# Patient Record
Sex: Female | Born: 1970 | Race: Black or African American | Hispanic: No | Marital: Single | Smoking: Never smoker
Health system: Southern US, Community
[De-identification: ages and names within clinical notes are randomized; demographics above are authoritative.]

## PROBLEM LIST (undated history)

## (undated) DIAGNOSIS — F32A Depression, unspecified: Secondary | ICD-10-CM

## (undated) DIAGNOSIS — F419 Anxiety disorder, unspecified: Secondary | ICD-10-CM

## (undated) DIAGNOSIS — M25569 Pain in unspecified knee: Secondary | ICD-10-CM

## (undated) DIAGNOSIS — R51 Headache: Secondary | ICD-10-CM

## (undated) DIAGNOSIS — M542 Cervicalgia: Secondary | ICD-10-CM

## (undated) DIAGNOSIS — R519 Headache, unspecified: Secondary | ICD-10-CM

## (undated) DIAGNOSIS — F329 Major depressive disorder, single episode, unspecified: Secondary | ICD-10-CM

## (undated) DIAGNOSIS — M549 Dorsalgia, unspecified: Secondary | ICD-10-CM

## (undated) DIAGNOSIS — M1990 Unspecified osteoarthritis, unspecified site: Secondary | ICD-10-CM

## (undated) HISTORY — DX: Pain in unspecified knee: M25.569

## (undated) HISTORY — DX: Headache: R51

## (undated) HISTORY — DX: Major depressive disorder, single episode, unspecified: F32.9

## (undated) HISTORY — DX: Anxiety disorder, unspecified: F41.9

## (undated) HISTORY — DX: Unspecified osteoarthritis, unspecified site: M19.90

## (undated) HISTORY — DX: Headache, unspecified: R51.9

## (undated) HISTORY — DX: Dorsalgia, unspecified: M54.9

## (undated) HISTORY — DX: Cervicalgia: M54.2

## (undated) HISTORY — DX: Depression, unspecified: F32.A

---

## 2016-03-19 DIAGNOSIS — R51 Headache: Secondary | ICD-10-CM | POA: Diagnosis not present

## 2016-03-24 DIAGNOSIS — M545 Low back pain: Secondary | ICD-10-CM | POA: Diagnosis not present

## 2016-03-31 DIAGNOSIS — M545 Low back pain: Secondary | ICD-10-CM | POA: Diagnosis not present

## 2016-03-31 DIAGNOSIS — M5187 Other intervertebral disc disorders, lumbosacral region: Secondary | ICD-10-CM | POA: Diagnosis not present

## 2016-03-31 DIAGNOSIS — M47816 Spondylosis without myelopathy or radiculopathy, lumbar region: Secondary | ICD-10-CM | POA: Diagnosis not present

## 2016-03-31 DIAGNOSIS — M5136 Other intervertebral disc degeneration, lumbar region: Secondary | ICD-10-CM | POA: Diagnosis not present

## 2016-05-21 DIAGNOSIS — Z01419 Encounter for gynecological examination (general) (routine) without abnormal findings: Secondary | ICD-10-CM | POA: Diagnosis not present

## 2016-05-21 DIAGNOSIS — A609 Anogenital herpesviral infection, unspecified: Secondary | ICD-10-CM | POA: Diagnosis not present

## 2016-05-21 DIAGNOSIS — Z6836 Body mass index (BMI) 36.0-36.9, adult: Secondary | ICD-10-CM | POA: Diagnosis not present

## 2016-05-21 DIAGNOSIS — N760 Acute vaginitis: Secondary | ICD-10-CM | POA: Diagnosis not present

## 2016-05-21 DIAGNOSIS — E559 Vitamin D deficiency, unspecified: Secondary | ICD-10-CM | POA: Diagnosis not present

## 2016-05-21 DIAGNOSIS — Z124 Encounter for screening for malignant neoplasm of cervix: Secondary | ICD-10-CM | POA: Diagnosis not present

## 2016-06-17 DIAGNOSIS — M531 Cervicobrachial syndrome: Secondary | ICD-10-CM | POA: Diagnosis not present

## 2016-06-17 DIAGNOSIS — M5386 Other specified dorsopathies, lumbar region: Secondary | ICD-10-CM | POA: Diagnosis not present

## 2016-06-17 DIAGNOSIS — M5416 Radiculopathy, lumbar region: Secondary | ICD-10-CM | POA: Diagnosis not present

## 2016-06-17 DIAGNOSIS — M9903 Segmental and somatic dysfunction of lumbar region: Secondary | ICD-10-CM | POA: Diagnosis not present

## 2016-06-18 DIAGNOSIS — M5416 Radiculopathy, lumbar region: Secondary | ICD-10-CM | POA: Diagnosis not present

## 2016-06-18 DIAGNOSIS — M9903 Segmental and somatic dysfunction of lumbar region: Secondary | ICD-10-CM | POA: Diagnosis not present

## 2016-06-18 DIAGNOSIS — M5386 Other specified dorsopathies, lumbar region: Secondary | ICD-10-CM | POA: Diagnosis not present

## 2016-06-18 DIAGNOSIS — M531 Cervicobrachial syndrome: Secondary | ICD-10-CM | POA: Diagnosis not present

## 2016-06-22 DIAGNOSIS — M5386 Other specified dorsopathies, lumbar region: Secondary | ICD-10-CM | POA: Diagnosis not present

## 2016-06-22 DIAGNOSIS — M531 Cervicobrachial syndrome: Secondary | ICD-10-CM | POA: Diagnosis not present

## 2016-06-22 DIAGNOSIS — M5416 Radiculopathy, lumbar region: Secondary | ICD-10-CM | POA: Diagnosis not present

## 2016-06-22 DIAGNOSIS — M9903 Segmental and somatic dysfunction of lumbar region: Secondary | ICD-10-CM | POA: Diagnosis not present

## 2016-06-23 DIAGNOSIS — M5416 Radiculopathy, lumbar region: Secondary | ICD-10-CM | POA: Diagnosis not present

## 2016-06-23 DIAGNOSIS — M531 Cervicobrachial syndrome: Secondary | ICD-10-CM | POA: Diagnosis not present

## 2016-06-23 DIAGNOSIS — M9903 Segmental and somatic dysfunction of lumbar region: Secondary | ICD-10-CM | POA: Diagnosis not present

## 2016-06-23 DIAGNOSIS — M5386 Other specified dorsopathies, lumbar region: Secondary | ICD-10-CM | POA: Diagnosis not present

## 2016-06-25 DIAGNOSIS — M5386 Other specified dorsopathies, lumbar region: Secondary | ICD-10-CM | POA: Diagnosis not present

## 2016-06-25 DIAGNOSIS — M5416 Radiculopathy, lumbar region: Secondary | ICD-10-CM | POA: Diagnosis not present

## 2016-06-25 DIAGNOSIS — M9903 Segmental and somatic dysfunction of lumbar region: Secondary | ICD-10-CM | POA: Diagnosis not present

## 2016-06-25 DIAGNOSIS — M531 Cervicobrachial syndrome: Secondary | ICD-10-CM | POA: Diagnosis not present

## 2016-06-29 DIAGNOSIS — M5416 Radiculopathy, lumbar region: Secondary | ICD-10-CM | POA: Diagnosis not present

## 2016-06-29 DIAGNOSIS — M5386 Other specified dorsopathies, lumbar region: Secondary | ICD-10-CM | POA: Diagnosis not present

## 2016-06-29 DIAGNOSIS — M531 Cervicobrachial syndrome: Secondary | ICD-10-CM | POA: Diagnosis not present

## 2016-06-29 DIAGNOSIS — M9903 Segmental and somatic dysfunction of lumbar region: Secondary | ICD-10-CM | POA: Diagnosis not present

## 2016-06-30 DIAGNOSIS — M9903 Segmental and somatic dysfunction of lumbar region: Secondary | ICD-10-CM | POA: Diagnosis not present

## 2016-06-30 DIAGNOSIS — M531 Cervicobrachial syndrome: Secondary | ICD-10-CM | POA: Diagnosis not present

## 2016-06-30 DIAGNOSIS — M797 Fibromyalgia: Secondary | ICD-10-CM | POA: Diagnosis not present

## 2016-06-30 DIAGNOSIS — R102 Pelvic and perineal pain: Secondary | ICD-10-CM | POA: Diagnosis not present

## 2016-06-30 DIAGNOSIS — M5416 Radiculopathy, lumbar region: Secondary | ICD-10-CM | POA: Diagnosis not present

## 2016-06-30 DIAGNOSIS — M5386 Other specified dorsopathies, lumbar region: Secondary | ICD-10-CM | POA: Diagnosis not present

## 2016-07-02 DIAGNOSIS — M531 Cervicobrachial syndrome: Secondary | ICD-10-CM | POA: Diagnosis not present

## 2016-07-02 DIAGNOSIS — M9903 Segmental and somatic dysfunction of lumbar region: Secondary | ICD-10-CM | POA: Diagnosis not present

## 2016-07-02 DIAGNOSIS — M5386 Other specified dorsopathies, lumbar region: Secondary | ICD-10-CM | POA: Diagnosis not present

## 2016-07-02 DIAGNOSIS — M5416 Radiculopathy, lumbar region: Secondary | ICD-10-CM | POA: Diagnosis not present

## 2016-07-06 DIAGNOSIS — M531 Cervicobrachial syndrome: Secondary | ICD-10-CM | POA: Diagnosis not present

## 2016-07-06 DIAGNOSIS — M5416 Radiculopathy, lumbar region: Secondary | ICD-10-CM | POA: Diagnosis not present

## 2016-07-06 DIAGNOSIS — M5386 Other specified dorsopathies, lumbar region: Secondary | ICD-10-CM | POA: Diagnosis not present

## 2016-07-06 DIAGNOSIS — M9903 Segmental and somatic dysfunction of lumbar region: Secondary | ICD-10-CM | POA: Diagnosis not present

## 2016-07-07 DIAGNOSIS — M5386 Other specified dorsopathies, lumbar region: Secondary | ICD-10-CM | POA: Diagnosis not present

## 2016-07-07 DIAGNOSIS — M531 Cervicobrachial syndrome: Secondary | ICD-10-CM | POA: Diagnosis not present

## 2016-07-07 DIAGNOSIS — M5416 Radiculopathy, lumbar region: Secondary | ICD-10-CM | POA: Diagnosis not present

## 2016-07-07 DIAGNOSIS — M9903 Segmental and somatic dysfunction of lumbar region: Secondary | ICD-10-CM | POA: Diagnosis not present

## 2016-07-14 DIAGNOSIS — M531 Cervicobrachial syndrome: Secondary | ICD-10-CM | POA: Diagnosis not present

## 2016-07-14 DIAGNOSIS — M5386 Other specified dorsopathies, lumbar region: Secondary | ICD-10-CM | POA: Diagnosis not present

## 2016-07-14 DIAGNOSIS — M5416 Radiculopathy, lumbar region: Secondary | ICD-10-CM | POA: Diagnosis not present

## 2016-07-14 DIAGNOSIS — M9903 Segmental and somatic dysfunction of lumbar region: Secondary | ICD-10-CM | POA: Diagnosis not present

## 2016-07-15 DIAGNOSIS — M5386 Other specified dorsopathies, lumbar region: Secondary | ICD-10-CM | POA: Diagnosis not present

## 2016-07-15 DIAGNOSIS — M5416 Radiculopathy, lumbar region: Secondary | ICD-10-CM | POA: Diagnosis not present

## 2016-07-15 DIAGNOSIS — M9903 Segmental and somatic dysfunction of lumbar region: Secondary | ICD-10-CM | POA: Diagnosis not present

## 2016-07-15 DIAGNOSIS — M531 Cervicobrachial syndrome: Secondary | ICD-10-CM | POA: Diagnosis not present

## 2016-07-21 DIAGNOSIS — M5386 Other specified dorsopathies, lumbar region: Secondary | ICD-10-CM | POA: Diagnosis not present

## 2016-07-21 DIAGNOSIS — M5416 Radiculopathy, lumbar region: Secondary | ICD-10-CM | POA: Diagnosis not present

## 2016-07-21 DIAGNOSIS — M9903 Segmental and somatic dysfunction of lumbar region: Secondary | ICD-10-CM | POA: Diagnosis not present

## 2016-07-21 DIAGNOSIS — M531 Cervicobrachial syndrome: Secondary | ICD-10-CM | POA: Diagnosis not present

## 2016-07-29 DIAGNOSIS — M5416 Radiculopathy, lumbar region: Secondary | ICD-10-CM | POA: Diagnosis not present

## 2016-07-29 DIAGNOSIS — M9903 Segmental and somatic dysfunction of lumbar region: Secondary | ICD-10-CM | POA: Diagnosis not present

## 2016-07-29 DIAGNOSIS — M531 Cervicobrachial syndrome: Secondary | ICD-10-CM | POA: Diagnosis not present

## 2016-07-29 DIAGNOSIS — M5386 Other specified dorsopathies, lumbar region: Secondary | ICD-10-CM | POA: Diagnosis not present

## 2016-07-30 DIAGNOSIS — M531 Cervicobrachial syndrome: Secondary | ICD-10-CM | POA: Diagnosis not present

## 2016-07-30 DIAGNOSIS — M9903 Segmental and somatic dysfunction of lumbar region: Secondary | ICD-10-CM | POA: Diagnosis not present

## 2016-07-30 DIAGNOSIS — M5386 Other specified dorsopathies, lumbar region: Secondary | ICD-10-CM | POA: Diagnosis not present

## 2016-07-30 DIAGNOSIS — M5416 Radiculopathy, lumbar region: Secondary | ICD-10-CM | POA: Diagnosis not present

## 2016-08-04 DIAGNOSIS — Z712 Person consulting for explanation of examination or test findings: Secondary | ICD-10-CM | POA: Diagnosis not present

## 2016-08-07 DIAGNOSIS — M2550 Pain in unspecified joint: Secondary | ICD-10-CM | POA: Diagnosis not present

## 2016-08-07 DIAGNOSIS — G4733 Obstructive sleep apnea (adult) (pediatric): Secondary | ICD-10-CM | POA: Diagnosis not present

## 2016-08-11 DIAGNOSIS — M5416 Radiculopathy, lumbar region: Secondary | ICD-10-CM | POA: Diagnosis not present

## 2016-08-11 DIAGNOSIS — M9903 Segmental and somatic dysfunction of lumbar region: Secondary | ICD-10-CM | POA: Diagnosis not present

## 2016-08-11 DIAGNOSIS — M531 Cervicobrachial syndrome: Secondary | ICD-10-CM | POA: Diagnosis not present

## 2016-08-11 DIAGNOSIS — M5386 Other specified dorsopathies, lumbar region: Secondary | ICD-10-CM | POA: Diagnosis not present

## 2016-08-25 DIAGNOSIS — M5416 Radiculopathy, lumbar region: Secondary | ICD-10-CM | POA: Diagnosis not present

## 2016-08-25 DIAGNOSIS — M5386 Other specified dorsopathies, lumbar region: Secondary | ICD-10-CM | POA: Diagnosis not present

## 2016-08-25 DIAGNOSIS — M9903 Segmental and somatic dysfunction of lumbar region: Secondary | ICD-10-CM | POA: Diagnosis not present

## 2016-08-25 DIAGNOSIS — M531 Cervicobrachial syndrome: Secondary | ICD-10-CM | POA: Diagnosis not present

## 2016-08-28 DIAGNOSIS — M542 Cervicalgia: Secondary | ICD-10-CM | POA: Diagnosis not present

## 2016-08-28 DIAGNOSIS — Z23 Encounter for immunization: Secondary | ICD-10-CM | POA: Diagnosis not present

## 2016-08-28 DIAGNOSIS — M545 Low back pain: Secondary | ICD-10-CM | POA: Diagnosis not present

## 2016-08-28 DIAGNOSIS — M797 Fibromyalgia: Secondary | ICD-10-CM | POA: Diagnosis not present

## 2016-09-04 DIAGNOSIS — I10 Essential (primary) hypertension: Secondary | ICD-10-CM | POA: Diagnosis not present

## 2016-09-17 DIAGNOSIS — M531 Cervicobrachial syndrome: Secondary | ICD-10-CM | POA: Diagnosis not present

## 2016-09-17 DIAGNOSIS — M5386 Other specified dorsopathies, lumbar region: Secondary | ICD-10-CM | POA: Diagnosis not present

## 2016-09-17 DIAGNOSIS — M9903 Segmental and somatic dysfunction of lumbar region: Secondary | ICD-10-CM | POA: Diagnosis not present

## 2016-09-17 DIAGNOSIS — M5416 Radiculopathy, lumbar region: Secondary | ICD-10-CM | POA: Diagnosis not present

## 2016-11-17 DIAGNOSIS — E669 Obesity, unspecified: Secondary | ICD-10-CM | POA: Diagnosis not present

## 2016-11-20 DIAGNOSIS — M25562 Pain in left knee: Secondary | ICD-10-CM | POA: Diagnosis not present

## 2016-11-20 DIAGNOSIS — M25861 Other specified joint disorders, right knee: Secondary | ICD-10-CM | POA: Diagnosis not present

## 2016-12-09 ENCOUNTER — Ambulatory Visit (INDEPENDENT_AMBULATORY_CARE_PROVIDER_SITE_OTHER): Payer: Federal, State, Local not specified - PPO | Admitting: Neurology

## 2016-12-09 ENCOUNTER — Encounter: Payer: Self-pay | Admitting: Neurology

## 2016-12-09 VITALS — BP 142/97 | HR 101 | Ht 59.0 in | Wt 177.8 lb

## 2016-12-09 DIAGNOSIS — R51 Headache with orthostatic component, not elsewhere classified: Secondary | ICD-10-CM

## 2016-12-09 DIAGNOSIS — G43009 Migraine without aura, not intractable, without status migrainosus: Secondary | ICD-10-CM | POA: Diagnosis not present

## 2016-12-09 DIAGNOSIS — M5412 Radiculopathy, cervical region: Secondary | ICD-10-CM

## 2016-12-09 DIAGNOSIS — F32A Depression, unspecified: Secondary | ICD-10-CM | POA: Insufficient documentation

## 2016-12-09 DIAGNOSIS — F419 Anxiety disorder, unspecified: Secondary | ICD-10-CM | POA: Diagnosis not present

## 2016-12-09 DIAGNOSIS — H539 Unspecified visual disturbance: Secondary | ICD-10-CM

## 2016-12-09 DIAGNOSIS — H9190 Unspecified hearing loss, unspecified ear: Secondary | ICD-10-CM

## 2016-12-09 DIAGNOSIS — M5430 Sciatica, unspecified side: Secondary | ICD-10-CM | POA: Insufficient documentation

## 2016-12-09 DIAGNOSIS — G43709 Chronic migraine without aura, not intractable, without status migrainosus: Secondary | ICD-10-CM

## 2016-12-09 DIAGNOSIS — M5416 Radiculopathy, lumbar region: Secondary | ICD-10-CM

## 2016-12-09 DIAGNOSIS — R519 Headache, unspecified: Secondary | ICD-10-CM

## 2016-12-09 DIAGNOSIS — G8929 Other chronic pain: Secondary | ICD-10-CM

## 2016-12-09 DIAGNOSIS — M797 Fibromyalgia: Secondary | ICD-10-CM

## 2016-12-09 DIAGNOSIS — M5431 Sciatica, right side: Secondary | ICD-10-CM

## 2016-12-09 DIAGNOSIS — M542 Cervicalgia: Secondary | ICD-10-CM | POA: Diagnosis not present

## 2016-12-09 DIAGNOSIS — F329 Major depressive disorder, single episode, unspecified: Secondary | ICD-10-CM | POA: Insufficient documentation

## 2016-12-09 MED ORDER — AMITRIPTYLINE HCL 10 MG PO TABS: 20.0000 mg | ORAL_TABLET | Freq: Every day | ORAL | 6 refills | Status: AC

## 2016-12-09 NOTE — Patient Instructions (Addendum)
Remember to drink plenty of fluid, eat healthy meals and do not skip any meals. Try to eat protein with a every meal and eat a healthy snack such as fruit or nuts in between meals. Try to keep a regular sleep-wake schedule and try to exercise daily, particularly in the form of walking, 20-30 minutes a day, if you can.   As far as your medications are concerned, I would like to suggest: Amitriptyline start with 10mg  at night and then can increase to 20mg  at night in one week  As far as diagnostic testing: MRI brain, [facility], emg/ncs  I would like to see you back for emg/ncs, sooner if we need to. Please call us with any interim questions, concerns, problems, updates or refill requests.   Our phone number is [phone]. We also have an after hours call service for urgent matters and there is a physician on-call for urgent questions. For any emergencies you know to call 911 or go to the nearest emergency room  Amitriptyline tablets  What should I tell my health care provider before I take this medicine? They need to know if you have any of these conditions: -an alcohol problem -asthma, difficulty breathing -bipolar disorder or schizophrenia -difficulty passing urine, prostate trouble -glaucoma -heart disease or previous heart attack -liver disease -over active thyroid -seizures -thoughts or plans of suicide, a previous suicide attempt, or family history of suicide attempt -an unusual or allergic reaction to amitriptyline, other medicines, foods, dyes, or preservatives -pregnant or trying to get pregnant -breast-feeding How should I use this medicine? Take this medicine by mouth with a drink of water. Follow the directions on the prescription label. You can take the tablets with or without food. Take your medicine at regular intervals. Do not take it more often than directed. Do not stop taking this medicine suddenly except upon the advice of your doctor. Stopping this  medicine too quickly may cause serious side effects or your condition may worsen. A special MedGuide will be given to you by the pharmacist with each prescription and refill. Be sure to read this information carefully each time. Talk to your pediatrician regarding the use of this medicine in children. Special care may be needed. Overdosage: If you think you have taken too much of this medicine contact a poison control center or emergency room at once. NOTE: This medicine is only for you. Do not share this medicine with others. What if I miss a dose? If you miss a dose, take it as soon as you can. If it is almost time for your next dose, take only that dose. Do not take double or extra doses. What may interact with this medicine? Do not take this medicine with any of the following medications: -arsenic trioxide -certain medicines used to regulate abnormal heartbeat or to treat other heart conditions -cisapride -droperidol -halofantrine -linezolid -MAOIs like Carbex, Eldepryl, Marplan, Nardil, and Parnate -methylene blue -other medicines for mental depression -phenothiazines like perphenazine, thioridazine and chlorpromazine -pimozide -probucol -procarbazine -sparfloxacin -St. John's Wort -ziprasidone This medicine may also interact with the following medications: -atropine and related drugs like hyoscyamine, scopolamine, tolterodine and others -barbiturate medicines for inducing sleep or treating seizures, like phenobarbital -cimetidine -disulfiram -ethchlorvynol -thyroid hormones such as levothyroxine This list may not describe all possible interactions. Give your health care provider a list of all the medicines, herbs, non-prescription drugs, or dietary supplements you use. Also tell them if you smoke, drink alcohol, or use illegal drugs. Some items may  interact with your medicine. What should I watch for while using this medicine? Tell your doctor if your symptoms do not get  better or if they get worse. Visit your doctor or health care professional for regular checks on your progress. Because it may take several weeks to see the full effects of this medicine, it is important to continue your treatment as prescribed by your doctor. Patients and their families should watch out for new or worsening thoughts of suicide or depression. Also watch out for sudden changes in feelings such as feeling anxious, agitated, panicky, irritable, hostile, aggressive, impulsive, severely restless, overly excited and hyperactive, or not being able to sleep. If this happens, especially at the beginning of treatment or after a change in dose, call your health care professional. Dennis Bast may get drowsy or dizzy. Do not drive, use machinery, or do anything that needs mental alertness until you know how this medicine affects you. Do not stand or sit up quickly, especially if you are an older patient. This reduces the risk of dizzy or fainting spells. Alcohol may interfere with the effect of this medicine. Avoid alcoholic drinks. Do not treat yourself for coughs, colds, or allergies without asking your doctor or health care professional for advice. Some ingredients can increase possible side effects. Your mouth may get dry. Chewing sugarless gum or sucking hard candy, and drinking plenty of water will help. Contact your doctor if the problem does not go away or is severe. This medicine may cause dry eyes and blurred vision. If you wear contact lenses you may feel some discomfort. Lubricating drops may help. See your eye doctor if the problem does not go away or is severe. This medicine can cause constipation. Try to have a bowel movement at least every 2 to 3 days. If you do not have a bowel movement for 3 days, call your doctor or health care professional. This medicine can make you more sensitive to the sun. Keep out of the sun. If you cannot avoid being in the sun, wear protective clothing and use  sunscreen. Do not use sun lamps or tanning beds/booths. What side effects may I notice from receiving this medicine? Side effects that you should report to your doctor or health care professional as soon as possible: -allergic reactions like skin rash, itching or hives, swelling of the face, lips, or tongue -anxious -breathing problems -changes in vision -confusion -elevated mood, decreased need for sleep, racing thoughts, impulsive behavior -eye pain -fast, irregular heartbeat -feeling faint or lightheaded, falls -feeling agitated, angry, or irritable -fever with increased sweating -hallucination, loss of contact with reality -seizures -stiff muscles -suicidal thoughts or other mood changes -tingling, pain, or numbness in the feet or hands -trouble passing urine or change in the amount of urine -trouble sleeping -unusually weak or tired -vomiting -yellowing of the eyes or skin Side effects that usually do not require medical attention (report to your doctor or health care professional if they continue or are bothersome): -change in sex drive or performance -change in appetite or weight -constipation -dizziness -dry mouth -nausea -tired -tremors -upset stomach This list may not describe all possible side effects. Call your doctor for medical advice about side effects. You may report side effects to FDA at 1-800-FDA-1088. Where should I keep my medicine? Keep out of the reach of children. Store at room temperature between 20 and 25 degrees C (68 and 77 degrees F). Throw away any unused medicine after the expiration date. NOTE: This sheet is  a summary. It may not cover all possible information. If you have questions about this medicine, talk to your doctor, pharmacist, or health care provider.  2017 Elsevier/Gold Standard (2016-03-27 12:14:15)

## 2016-12-09 NOTE — Progress Notes (Addendum)
[facility]    Provider:  Dr Jaynee Eagles Referring Provider: Maurice Small, MD Primary Care Physician:  Jonathon Bellows, MD  CC:  "Migraines, sciatic nerve, cervical neuralgia, right and left arm radiculopathy)  HPI:  Veronica Owen is a 46 y.o. female here as a referral from Dr. Justin Mend for worsening radiculopathy. Past medical history  migraine, depression, anxiety, fibromyalgia, obesity, fibromyalgia brain fog, PTSD. She says today her headaches are the main concern. She has had headaches since the 90s in the [employer]. Initially would "come and go" and would produce anxiety and depression. Stress is now a trigger as is her fibromyalgia with whole body pain and then migraines and headaches. Her head feels heavy and "stuffed", a lot of pressure. She has a lot of cervical muscle pain, starts more on hte right and right side of the head, a lot of pressure on the top and sides and frontal area as well as on the back of the head. She has the headaches 2-3x a week and they can last several days . Started worsening the beginning of 2016. She has headaches 8-15 migrainous headache days a month. Taking a break or coffee helps. She has light and sound sensitivity and smell triggers, dark rooms help. She has nausea but no vomiting. Mother had migraines. She has vision changes, her vision is worsening especially with a headache she has acute vision worsening. She has numbness and tingling in the arms since 2012. Worsening in frequency and severity. She has been diagnosed with cervical neuralgia. She has weakness sin the arms, she can;t open a water bottle to grip it. Even driving sometimes to hold her arms up can be difficult. She has a lot of pain when she sleeps with insomnia. She has had an emg/ncs which was negative in 2015 as well as an MRI of the cervical spine. She has sciatica as well.   Meds tried: Topamax, imitrex (which made her feel bad like she was wound up all the time, increased anxiety),  cymbalta (just started 60 mg), gabapentin 200mg  (can't tolerate during the day), flexeril.  Reviewed notes, labs and imaging from outside physicians, which showed:  Reviewed 34 pages from the [place]:   Labs collected 03/31/2016 include normal CMP with creatinine 0.937, BUN 9, vitamin D low 15.85, TSH 1.56, cholesterol 190, triglycerides 158, LDL 91, normal LFTs, HIV negative hep C negative, EKG with tachycardia sinus otherwise normal, CBC 12/19/2014 was unremarkable. Negative urine drug screen 11/28/2014. Sedimentation rate 10/17/2014 was 30, CCP IgG antibodies less than 16, ANA negative, anti-chromatin antibody negative, anti-scleroderma 70 antibody negative anticentromere B antibodies negative, Smith RNP antibody negative, anti-ribosomal P protein negative, anti-RNP negative, specifically antibody negative, Sjogren's antibody negative SSB and SSA, anti-Jo 1 negative anti-double-stranded DNA negative, rheumatoid factor less than 10 which is normal. Vitamin B12 on November 2018 was normal for 63.  MRI of the lumbar spine: 04/23/2016 showed mild lower lumbar discogenic degenerative disease specifically a broad small central disc protrusion at L4-L5 with mild bilateral facet arthropathy and mild bilateral foraminal narrowing. At L5-S1 there is a broad moderate central disc protrusion with associated annular fissure. There is mild bilateral facet arthropathy. There is mild bilateral foraminal narrowing. There is no canal stenosis. The disc protrusion appears slightly increased in size compared with 2015. Otherwise no interval change. No paraspinal abnormalities seen. There is no canal stenosis. No nerve root pinching.  MRI of the cervical spine 01/31/2015 impression cervical spondylosis with multilevel neural foraminal narrowing specifically C2-C3 subtle  and uncovertebral hypertrophy, C3-C4 mild bilateral uncovertebral hypertrophy and neural foraminal encroachment, C4-C5 mild disc height loss, moderate broad  disc bulge with moderate to severe uncovertebral hypertrophy, zagopophyseal facets normal, narrowing of the ventral thecal sac, moderate left and near severe right neural foraminal narrowing. C5-C6 mild disc height loss and broad disc bulge, moderate uncovertebral hypertrophy, mild impression on the ventral cord surface, mild zagopophyseal facet arthrosis, moderate bilateral neural foraminal narrowing. C6-C7 mild disc height loss and broad disc bulge, moderate uncovertebral hypertrophy, mild bilateral neural foraminal narrowing, C7-T1 central disc bulge facets normal.   Review of Systems: Patient complains of symptoms per HPI as well as the following symptoms: Fatigue, blurred vision, joint pain, aching muscles, headache, weakness, sleepiness, depression, anxiety, decreased energy, disinterest in activities. Pertinent negatives per HPI. All others negative.   Social History   Social History  . Marital status: Single    Spouse name: N/A  . Number of children: 1  . Years of education: Doctoral   Occupational History  . [employer]    Social History Main Topics  . Smoking status: Never Smoker  . Smokeless tobacco: Never Used  . Alcohol use Yes     Comment: 5-8 oz  . Drug use: Yes    Types: Marijuana     Comment: Very rare for pain  . Sexual activity: Not on file   Other Topics Concern  . Not on file   Social History Narrative   Lives with daughter   Caffeine use: 10-20oz (coffee daily)   Tea/soda- ocass    Family History  Problem Relation Age of Onset  . High Cholesterol Mother   . Diabetes Mother   . Hypertension Father   . Anxiety disorder Father   . Depression Father   . Breast cancer Sister     Past Medical History:  Diagnosis Date  . Arthritis   . Back pain   . Headache   . Knee pain bilat  . Neck pain     Past Surgical History:  Procedure Laterality Date  . CESAREAN SECTION  1994    Current Outpatient Prescriptions  Medication Sig Dispense  Refill  . amLODipine (NORVASC) 5 MG tablet Take 5 mg by mouth daily.    . cyclobenzaprine (FLEXERIL) 10 MG tablet Take 10 mg by mouth as needed for muscle spasms.    . DULoxetine (CYMBALTA) 60 MG capsule Take 60 mg by mouth daily.    Marland Kitchen gabapentin (NEURONTIN) 100 MG capsule Take 200 mg by mouth at bedtime.    . naproxen (NAPROSYN) 500 MG tablet Take 500 mg by mouth as needed.    . pantoprazole (PROTONIX) 40 MG tablet Take 40 mg by mouth daily.    . phentermine 37.5 MG capsule Take 1 capsule by mouth daily.    . traZODone (DESYREL) 50 MG tablet Take 50 mg by mouth at bedtime. Takes 0.5-1 tablet as needed    . amitriptyline (ELAVIL) 10 MG tablet Take 2 tablets (20 mg total) by mouth at bedtime. 60 tablet 6   No current facility-administered medications for this visit.     Allergies as of 12/09/2016  . (No Known Allergies)    Vitals: BP (!) 142/97 (BP Location: Right Arm, Patient Position: Sitting, Cuff Size: Large)   Pulse (!) 101   Ht 4\' 11"  (1.499 m)   Wt 177 lb 12.8 oz (80.6 kg)   BMI 35.91 kg/m  Last Weight:  Wt Readings from Last 1 Encounters:  12/09/16 177  lb 12.8 oz (80.6 kg)   Last Height:   Ht Readings from Last 1 Encounters:  12/09/16 4\' 11"  (1.499 m)   Physical exam: Exam: Gen: NAD, conversant, well nourised, obese, well groomed                     CV: RRR, no MRG. No Carotid Bruits. No peripheral edema, warm, nontender Eyes: Conjunctivae clear without exudates or hemorrhage MSK: hypertrophied right trapezius and paraspinal muscles, tender to palpation  Neuro: Detailed Neurologic Exam  Speech:    Speech is normal; fluent and spontaneous with normal comprehension.  Cognition:    The patient is oriented to person, place, and time;     recent and remote memory intact;     language fluent;     normal attention, concentration,     fund of knowledge Cranial Nerves:    The pupils are equal, round, and reactive to light. The fundi are normal and spontaneous venous  pulsations are present. Visual fields are full to finger confrontation. Extraocular movements are intact. Trigeminal sensation is intact and the muscles of mastication are normal. The face is symmetric. The palate elevates in the midline. Hearing intact. Voice is normal. Shoulder shrug is normal. The tongue has normal motion without fasciculations.   Coordination:    Normal finger to nose and heel to shin. Normal rapid alternating movements.   Gait:    Heel-toe and tandem gait are normal.   Motor Observation:    No asymmetry, no atrophy, and no involuntary movements noted. Tone:    Normal muscle tone.    Posture:    Posture is normal. normal erect    Strength:    Strength is V/V in the upper and lower limbs.      Sensation: intact to LT     Reflex Exam:  DTR's:    Deep tendon reflexes in the upper and lower extremities are normal bilaterally.   Toes:    The toes are downgoing bilaterally.   Clonus:    Clonus is absent.   Assessment/Plan:  46 y.o. female here as a referral from Dr. Justin Mend for worsening radiculopathy. Past medical history hypertension, migraine, depression, anxiety, fibromyalgia, obesity, fibromyalgia brain fog, PTSD. She has chronic migraines, cervicalgia, musculoskeletal neck pain, cervical radiculopathy and sciatica  - Emg/ncs of bilateral upper extremities and right leg to eval for cervical radiculopathy and sciatic neuropathy/lumbar radiculopathy - Patient has hypertrophied right trapezius and other cervical muscles that can be contributing to her migraine and cervicalgia. She does have a lot of musculoskeletal neck pain, chronic pain and fibromyalgia as well as sciatica. Refer her to [facility] for physical therapy, deep tissue massage, acupuncture, stress and pain management, biofeedback. - MRI of the brain given her chronic headaches worse supine/positionally, vision/hearing changes to evaluate for other intracranial abnormalities that can cause  headaches such as lesions or intracranial hypertension. - As far as your medications are concerned, I would like to suggest: Amitriptyline start with 10mg  at night and then can increase to 20mg  at night in one week   Discussed the following: To prevent or relieve headaches, try the following: Cool Compress. Lie down and place a cool compress on your head.  Avoid headache triggers. If certain foods or odors seem to have triggered your migraines in the past, avoid them. A headache diary might help you identify triggers.  Include physical activity in your daily routine. Try a daily walk or other moderate aerobic exercise.  Manage stress.  Find healthy ways to cope with the stressors, such as delegating tasks on your to-do list.  Practice relaxation techniques. Try deep breathing, yoga, massage and visualization.  Eat regularly. Eating regularly scheduled meals and maintaining a healthy diet might help prevent headaches. Also, drink plenty of fluids.  Follow a regular sleep schedule. Sleep deprivation might contribute to headaches Consider biofeedback. With this mind-body technique, you learn to control certain bodily functions - such as muscle tension, heart rate and blood pressure - to prevent headaches or reduce headache pain.    Proceed to emergency room if you experience new or worsening symptoms or symptoms do not resolve, if you have new neurologic symptoms or if headache is severe, or for any concerning symptom.   CC: Dr. Fabio Asa, MD  [facility] [address] [place],  [address]  Phone [phone] Fax [phone]

## 2016-12-15 ENCOUNTER — Telehealth: Payer: Self-pay | Admitting: Neurology

## 2016-12-15 NOTE — Telephone Encounter (Signed)
Sent via fax to [facility]  [phone]. Thanks Hinton Dyer

## 2016-12-22 DIAGNOSIS — M545 Low back pain: Secondary | ICD-10-CM | POA: Diagnosis not present

## 2016-12-22 DIAGNOSIS — M542 Cervicalgia: Secondary | ICD-10-CM | POA: Diagnosis not present

## 2016-12-22 DIAGNOSIS — G43909 Migraine, unspecified, not intractable, without status migrainosus: Secondary | ICD-10-CM | POA: Diagnosis not present

## 2016-12-22 DIAGNOSIS — M797 Fibromyalgia: Secondary | ICD-10-CM | POA: Diagnosis not present

## 2016-12-22 DIAGNOSIS — M25562 Pain in left knee: Secondary | ICD-10-CM | POA: Diagnosis not present

## 2016-12-24 DIAGNOSIS — G4733 Obstructive sleep apnea (adult) (pediatric): Secondary | ICD-10-CM | POA: Diagnosis not present

## 2016-12-29 DIAGNOSIS — M797 Fibromyalgia: Secondary | ICD-10-CM | POA: Diagnosis not present

## 2016-12-29 DIAGNOSIS — G43909 Migraine, unspecified, not intractable, without status migrainosus: Secondary | ICD-10-CM | POA: Diagnosis not present

## 2016-12-29 DIAGNOSIS — M545 Low back pain: Secondary | ICD-10-CM | POA: Diagnosis not present

## 2016-12-29 DIAGNOSIS — M542 Cervicalgia: Secondary | ICD-10-CM | POA: Diagnosis not present

## 2017-01-05 DIAGNOSIS — M542 Cervicalgia: Secondary | ICD-10-CM | POA: Diagnosis not present

## 2017-01-05 DIAGNOSIS — M545 Low back pain: Secondary | ICD-10-CM | POA: Diagnosis not present

## 2017-01-05 DIAGNOSIS — M797 Fibromyalgia: Secondary | ICD-10-CM | POA: Diagnosis not present

## 2017-01-05 DIAGNOSIS — G43909 Migraine, unspecified, not intractable, without status migrainosus: Secondary | ICD-10-CM | POA: Diagnosis not present

## 2017-01-08 DIAGNOSIS — G4733 Obstructive sleep apnea (adult) (pediatric): Secondary | ICD-10-CM | POA: Diagnosis not present

## 2017-01-19 DIAGNOSIS — G43909 Migraine, unspecified, not intractable, without status migrainosus: Secondary | ICD-10-CM | POA: Diagnosis not present

## 2017-01-19 DIAGNOSIS — M797 Fibromyalgia: Secondary | ICD-10-CM | POA: Diagnosis not present

## 2017-01-19 DIAGNOSIS — M542 Cervicalgia: Secondary | ICD-10-CM | POA: Diagnosis not present

## 2017-01-19 DIAGNOSIS — M545 Low back pain: Secondary | ICD-10-CM | POA: Diagnosis not present

## 2017-01-21 ENCOUNTER — Encounter: Payer: Federal, State, Local not specified - PPO | Admitting: Neurology

## 2017-01-25 DIAGNOSIS — H524 Presbyopia: Secondary | ICD-10-CM | POA: Diagnosis not present

## 2017-01-25 DIAGNOSIS — H35033 Hypertensive retinopathy, bilateral: Secondary | ICD-10-CM | POA: Diagnosis not present

## 2017-01-25 DIAGNOSIS — H04123 Dry eye syndrome of bilateral lacrimal glands: Secondary | ICD-10-CM | POA: Diagnosis not present

## 2017-01-25 DIAGNOSIS — H33321 Round hole, right eye: Secondary | ICD-10-CM | POA: Diagnosis not present

## 2017-02-12 DIAGNOSIS — Z113 Encounter for screening for infections with a predominantly sexual mode of transmission: Secondary | ICD-10-CM | POA: Diagnosis not present

## 2017-02-12 DIAGNOSIS — N760 Acute vaginitis: Secondary | ICD-10-CM | POA: Diagnosis not present

## 2017-02-16 DIAGNOSIS — B373 Candidiasis of vulva and vagina: Secondary | ICD-10-CM | POA: Diagnosis not present

## 2017-02-16 DIAGNOSIS — N760 Acute vaginitis: Secondary | ICD-10-CM | POA: Diagnosis not present

## 2017-03-04 ENCOUNTER — Ambulatory Visit (INDEPENDENT_AMBULATORY_CARE_PROVIDER_SITE_OTHER): Payer: Self-pay | Admitting: Neurology

## 2017-03-04 ENCOUNTER — Ambulatory Visit (INDEPENDENT_AMBULATORY_CARE_PROVIDER_SITE_OTHER): Payer: Federal, State, Local not specified - PPO | Admitting: Neurology

## 2017-03-04 DIAGNOSIS — M5410 Radiculopathy, site unspecified: Secondary | ICD-10-CM

## 2017-03-04 DIAGNOSIS — M5416 Radiculopathy, lumbar region: Secondary | ICD-10-CM

## 2017-03-04 DIAGNOSIS — R52 Pain, unspecified: Secondary | ICD-10-CM | POA: Insufficient documentation

## 2017-03-04 DIAGNOSIS — R519 Headache, unspecified: Secondary | ICD-10-CM

## 2017-03-04 DIAGNOSIS — M5412 Radiculopathy, cervical region: Secondary | ICD-10-CM | POA: Diagnosis not present

## 2017-03-04 DIAGNOSIS — G8929 Other chronic pain: Secondary | ICD-10-CM

## 2017-03-04 DIAGNOSIS — R51 Headache: Principal | ICD-10-CM

## 2017-03-04 DIAGNOSIS — Z0289 Encounter for other administrative examinations: Secondary | ICD-10-CM

## 2017-03-04 DIAGNOSIS — G43009 Migraine without aura, not intractable, without status migrainosus: Secondary | ICD-10-CM

## 2017-03-04 DIAGNOSIS — M542 Cervicalgia: Secondary | ICD-10-CM

## 2017-03-04 NOTE — Progress Notes (Signed)
Full Name: Veronica Owen Gender: Female MRN #: [id number] Date of Birth: 02/05/1971    Visit Date: 03/04/2017 09:58 Age: 46 Years 0 Months Old Examining Physician: Sarina Ill, MD  Referring Physician: Maurice Small, MD    History: This is a 46 year old patient here as a referral for worsening radiculopathy.  Summary: Nerve conduction and EMG studies were performed on the right upper and right lower extremities. All muscles and nerves (as detailed in the following tables) were normal  Conclusion: This is a normal study.  Cc: Dr. Fabio Asa, M.D.  [facility] [place], [place] [address] Tel: [phone] Fax: [phone]        [facility]    Nerve / Sites Rec. Site Latency Ref. Amplitude Ref. Rel Amp Segments Distance Velocity Ref. Area    ms ms mV mV %  cm m/s m/s mVms  R Median - APB     Wrist APB 2.9 ?4.4 6.6 ?4.0 100 Wrist - APB 7   27.2     Upper arm APB 6.4  6.4  96.5 Upper arm - Wrist 18 52 ?49 26.1  R Ulnar - ADM     Wrist ADM 2.0 ?3.3 9.4 ?6.0 100 Wrist - ADM 7   26.4     B.Elbow ADM 4.5  9.2  98.4 B.Elbow - Wrist 16 65 ?49 25.3     A.Elbow ADM 6.5  9.1  98.5 A.Elbow - B.Elbow 12 59 ?49 25.5         A.Elbow - Wrist      R Peroneal - EDB     Ankle EDB 4.0 ?6.5 7.1 ?2.0 100 Ankle - EDB 9   20.8     Fib head EDB 9.4  6.9  96.6 Fib head - Ankle 27 50 ?44 19.1     Pop fossa EDB 11.1  6.7  97.3 Pop fossa - Fib head 10 56 ?44 18.9         Pop fossa - Ankle      R Tibial - AH     Ankle AH 3.6 ?5.8 6.1 ?4.0 100 Ankle - AH 9   12.5     Pop fossa AH 11.2  3.4  54.9 Pop fossa - Ankle 33 44 ?41 10.6             [facility]    Nerve / Sites Rec. Site Peak Lat Ref.  Amp Ref. Segments Distance Peak Diff Ref.    ms ms V V  cm ms ms  R Sural - Ankle (Calf)     Calf Ankle 2.45 ?4.40 35 ?6 Calf - Ankle 14    R Superficial peroneal - Ankle     Lat leg Ankle 3.18 ?4.40 19 ?6 Lat leg - Ankle 14    R Median, Ulnar - Transcarpal comparison   Median Palm Wrist 1.98 ?2.20 63 ?35 Median Palm - Wrist 8       Ulnar Palm Wrist 1.88 ?2.20 10 ?12 Ulnar Palm - Wrist 8          Median Palm - Ulnar Palm  0.1 ?0.4  R Median - Orthodromic (Dig II, Mid palm)     Dig II Wrist 2.86 ?3.40 19 ?10 Dig II - Wrist 13    R Ulnar - Orthodromic, (Dig V, Mid palm)     Dig V Wrist 2.24 ?3.10 13 ?5 Dig V - Wrist 11  F  Wave    Nerve F Lat Ref.   ms ms  R Median - APB 22.8 ?31.0  R Tibial - AH 45.5 ?56.0         EMG full       EMG Summary Table    Spontaneous MUAP Recruitment  Muscle IA Fib PSW Fasc Other Amp Dur. Poly Pattern  R. Deltoid Normal None None None _______ Normal Normal Normal Normal  R. Trapezius Normal None None None _______ Normal Normal Normal Normal  R. Pronator teres Normal None None None _______ Normal Normal Normal Normal  R. First dorsal interosseous Normal None None None _______ Normal Normal Normal Normal  R. Opponens pollicis Normal None None None _______ Normal Normal Normal Normal  R. Cervical paraspinals (low) Normal None None None _______ Normal Normal Normal Normal  R. Lumbar paraspinals (low) Normal None None None _______ Normal Normal Normal Normal  R. Vastus medialis Normal None None None _______ Normal Normal Normal Normal  R. Tibialis anterior Normal None None None _______ Normal Normal Normal Normal  R. Gastrocnemius (Medial head) Normal None None None _______ Normal Normal Normal Normal  R. Gluteus maximus Normal None None None _______ Normal Normal Normal Normal  R. Biceps femoris (long head) Normal None None None _______ Normal Normal Normal Normal  R. Biceps femoris (short head) Normal None None None _______ Normal Normal Normal Normal

## 2017-03-04 NOTE — Addendum Note (Signed)
Addended by: Sarina Ill B on: 03/04/2017 10:53 AM   Modules accepted: Orders

## 2017-03-05 LAB — BASIC METABOLIC PANEL
BUN/Creatinine Ratio: 10 (ref 9–23)
BUN: 8 mg/dL (ref 6–24)
CO2: 26 mmol/L (ref 18–29)
CREATININE: 0.79 mg/dL (ref 0.57–1.00)
Calcium: 8.9 mg/dL (ref 8.7–10.2)
Chloride: 103 mmol/L (ref 96–106)
GFR, EST AFRICAN AMERICAN: 104 mL/min/{1.73_m2} (ref >59)
GFR, EST NON AFRICAN AMERICAN: 90 mL/min/{1.73_m2} (ref >59)
Glucose: 77 mg/dL (ref 65–99)
Potassium: 4 mmol/L (ref 3.5–5.2)
SODIUM: 143 mmol/L (ref 134–144)

## 2017-03-06 NOTE — Progress Notes (Signed)
See procedure note.

## 2017-03-06 NOTE — Procedures (Signed)
Full Name: Veronica Owen Gender: Female MRN #: [id number] Date of Birth: [birth year]    Visit Date: 03/04/2017 09:58 Age: 46 Years 0 Months Old Examining Physician: Sarina Ill, MD  Referring Physician: Maurice Small, MD    History: This is a 46 year old patient here as a referral for worsening radiculopathy.  Summary: Nerve conduction and EMG studies were performed on the right upper and right lower extremities. All muscles and nerves (as detailed in the following tables) were normal  Conclusion: This is a normal study.  Cc: Dr. Fabio Asa, M.D.  [facility] [place], [place] [address] Tel: [phone] Fax: [phone]        [facility]    Nerve / Sites Rec. Site Latency Ref. Amplitude Ref. Rel Amp Segments Distance Velocity Ref. Area    ms ms mV mV %  cm m/s m/s mVms  R Median - APB     Wrist APB 2.9 ?4.4 6.6 ?4.0 100 Wrist - APB 7   27.2     Upper arm APB 6.4  6.4  96.5 Upper arm - Wrist 18 52 ?49 26.1  R Ulnar - ADM     Wrist ADM 2.0 ?3.3 9.4 ?6.0 100 Wrist - ADM 7   26.4     B.Elbow ADM 4.5  9.2  98.4 B.Elbow - Wrist 16 65 ?49 25.3     A.Elbow ADM 6.5  9.1  98.5 A.Elbow - B.Elbow 12 59 ?49 25.5         A.Elbow - Wrist      R Peroneal - EDB     Ankle EDB 4.0 ?6.5 7.1 ?2.0 100 Ankle - EDB 9   20.8     Fib head EDB 9.4  6.9  96.6 Fib head - Ankle 27 50 ?44 19.1     Pop fossa EDB 11.1  6.7  97.3 Pop fossa - Fib head 10 56 ?44 18.9         Pop fossa - Ankle      R Tibial - AH     Ankle AH 3.6 ?5.8 6.1 ?4.0 100 Ankle - AH 9   12.5     Pop fossa AH 11.2  3.4  54.9 Pop fossa - Ankle 33 44 ?41 10.6             SNC    Nerve / Sites Rec. Site Peak Lat Ref.  Amp Ref. Segments Distance Peak Diff Ref.    ms ms V V  cm ms ms  R Sural - Ankle (Calf)     Calf Ankle 2.45 ?4.40 35 ?6 Calf - Ankle 14    R Superficial peroneal - Ankle     Lat leg Ankle 3.18 ?4.40 19 ?6 Lat leg - Ankle 14    R Median, Ulnar - Transcarpal comparison   Median Palm Wrist 1.98 ?2.20 63 ?35 Median Palm - Wrist 8       Ulnar Palm Wrist 1.88 ?2.20 10 ?12 Ulnar Palm - Wrist 8          Median Palm - Ulnar Palm  0.1 ?0.4  R Median - Orthodromic (Dig II, Mid palm)     Dig II Wrist 2.86 ?3.40 19 ?10 Dig II - Wrist 13    R Ulnar - Orthodromic, (Dig V, Mid palm)     Dig V Wrist 2.24 ?3.10 13 ?5 Dig V - Wrist 11  F  Wave    Nerve F Lat Ref.   ms ms  R Median - APB 22.8 ?31.0  R Tibial - AH 45.5 ?56.0         EMG full       EMG Summary Table    Spontaneous MUAP Recruitment  Muscle IA Fib PSW Fasc Other Amp Dur. Poly Pattern  R. Deltoid Normal None None None _______ Normal Normal Normal Normal  R. Trapezius Normal None None None _______ Normal Normal Normal Normal  R. Pronator teres Normal None None None _______ Normal Normal Normal Normal  R. First dorsal interosseous Normal None None None _______ Normal Normal Normal Normal  R. Opponens pollicis Normal None None None _______ Normal Normal Normal Normal  R. Cervical paraspinals (low) Normal None None None _______ Normal Normal Normal Normal  R. Lumbar paraspinals (low) Normal None None None _______ Normal Normal Normal Normal  R. Vastus medialis Normal None None None _______ Normal Normal Normal Normal  R. Tibialis anterior Normal None None None _______ Normal Normal Normal Normal  R. Gastrocnemius (Medial head) Normal None None None _______ Normal Normal Normal Normal  R. Gluteus maximus Normal None None None _______ Normal Normal Normal Normal  R. Biceps femoris (long head) Normal None None None _______ Normal Normal Normal Normal  R. Biceps femoris (short head) Normal None None None _______ Normal Normal Normal Normal

## 2017-03-10 ENCOUNTER — Ambulatory Visit (INDEPENDENT_AMBULATORY_CARE_PROVIDER_SITE_OTHER): Payer: Federal, State, Local not specified - PPO

## 2017-03-10 DIAGNOSIS — G8929 Other chronic pain: Secondary | ICD-10-CM

## 2017-03-10 DIAGNOSIS — H539 Unspecified visual disturbance: Secondary | ICD-10-CM

## 2017-03-10 DIAGNOSIS — R51 Headache with orthostatic component, not elsewhere classified: Secondary | ICD-10-CM

## 2017-03-10 DIAGNOSIS — H905 Unspecified sensorineural hearing loss: Secondary | ICD-10-CM | POA: Diagnosis not present

## 2017-03-10 DIAGNOSIS — H9190 Unspecified hearing loss, unspecified ear: Secondary | ICD-10-CM

## 2017-03-10 DIAGNOSIS — R519 Headache, unspecified: Secondary | ICD-10-CM

## 2017-03-10 MED ORDER — GADOPENTETATE DIMEGLUMINE 469.01 MG/ML IV SOLN: 17.0000 mL | Freq: Once | INTRAVENOUS | Status: AC | PRN

## 2017-03-11 ENCOUNTER — Telehealth: Payer: Self-pay | Admitting: *Deleted

## 2017-03-11 NOTE — Telephone Encounter (Signed)
Per Dr Jaynee Eagles, spoke with patient and informed her that her labs are normal. She inquired about her MRI which was yesterday. Advised she will get a call when those results are available. She verbalized understanding, appreciation.

## 2017-03-12 ENCOUNTER — Telehealth: Payer: Self-pay | Admitting: *Deleted

## 2017-03-12 NOTE — Telephone Encounter (Addendum)
Called and spoke with patient. Advised MRI unremarkable per AA,MD note. She verbalized understanding. She states she went for a few visits to integrative therapy. Her copay was 30 dollars for each visit. She saw a physical therapists who did specific exercises, massage therapy,  And dry needling. She felt the dry needling was the only thing that helped a little. She is wondering if there are any other options for her to try.  Advised I will speak with AA,MD and we will call back to advise either today or Monday.   She is still taking amitriptyline as prescribed.

## 2017-03-12 NOTE — Telephone Encounter (Signed)
-----   Message from Melvenia Beam, MD sent at 03/12/2017 11:43 AM EDT ----- MRI brain unremarkable

## 2017-03-15 NOTE — Telephone Encounter (Signed)
Yes we can increase to 50mg  amitriptyline is she is willing, thanks

## 2017-03-15 NOTE — Telephone Encounter (Signed)
Returned call to pt and let her know that she could increase amitriptyline to 50 mg at bedtime. Says that she has been taking med around 7 or 8 pm in order not to feel sleepy when waking up the next morning. Agreed to go up to 30 mg for the next few days and then will increase up to 50 as tolerated. Will call back if HAs worsen or do not improve and if she needs new rx for 50 mg tabs. Verbalized understanding and appreciation for call.

## 2017-03-23 DIAGNOSIS — F332 Major depressive disorder, recurrent severe without psychotic features: Secondary | ICD-10-CM | POA: Diagnosis not present

## 2017-03-23 DIAGNOSIS — R000 Tachycardia, unspecified: Secondary | ICD-10-CM | POA: Diagnosis not present

## 2017-03-23 DIAGNOSIS — F411 Generalized anxiety disorder: Secondary | ICD-10-CM | POA: Diagnosis not present

## 2017-03-23 DIAGNOSIS — R45851 Suicidal ideations: Secondary | ICD-10-CM | POA: Diagnosis not present

## 2017-03-23 DIAGNOSIS — R9431 Abnormal electrocardiogram [ECG] [EKG]: Secondary | ICD-10-CM | POA: Diagnosis not present

## 2017-03-23 DIAGNOSIS — I10 Essential (primary) hypertension: Secondary | ICD-10-CM | POA: Diagnosis not present

## 2017-03-23 DIAGNOSIS — F329 Major depressive disorder, single episode, unspecified: Secondary | ICD-10-CM | POA: Diagnosis not present

## 2017-03-23 DIAGNOSIS — F419 Anxiety disorder, unspecified: Secondary | ICD-10-CM | POA: Diagnosis not present

## 2017-03-23 DIAGNOSIS — F4310 Post-traumatic stress disorder, unspecified: Secondary | ICD-10-CM | POA: Diagnosis not present

## 2017-03-24 DIAGNOSIS — F411 Generalized anxiety disorder: Secondary | ICD-10-CM | POA: Diagnosis not present

## 2017-03-24 DIAGNOSIS — F332 Major depressive disorder, recurrent severe without psychotic features: Secondary | ICD-10-CM | POA: Diagnosis not present

## 2017-03-24 DIAGNOSIS — R45851 Suicidal ideations: Secondary | ICD-10-CM | POA: Diagnosis not present

## 2017-03-24 DIAGNOSIS — Z91410 Personal history of adult physical and sexual abuse: Secondary | ICD-10-CM | POA: Diagnosis not present

## 2017-03-24 DIAGNOSIS — M797 Fibromyalgia: Secondary | ICD-10-CM | POA: Diagnosis not present

## 2017-03-24 DIAGNOSIS — F4310 Post-traumatic stress disorder, unspecified: Secondary | ICD-10-CM | POA: Diagnosis not present

## 2017-03-24 DIAGNOSIS — G43909 Migraine, unspecified, not intractable, without status migrainosus: Secondary | ICD-10-CM | POA: Diagnosis not present

## 2017-03-24 DIAGNOSIS — I10 Essential (primary) hypertension: Secondary | ICD-10-CM | POA: Diagnosis not present

## 2017-03-25 DIAGNOSIS — F332 Major depressive disorder, recurrent severe without psychotic features: Secondary | ICD-10-CM | POA: Diagnosis not present

## 2017-03-28 DIAGNOSIS — F332 Major depressive disorder, recurrent severe without psychotic features: Secondary | ICD-10-CM | POA: Diagnosis not present

## 2017-04-01 DIAGNOSIS — G43909 Migraine, unspecified, not intractable, without status migrainosus: Secondary | ICD-10-CM | POA: Diagnosis not present

## 2017-04-09 DIAGNOSIS — F411 Generalized anxiety disorder: Secondary | ICD-10-CM | POA: Diagnosis not present

## 2017-04-09 DIAGNOSIS — F4310 Post-traumatic stress disorder, unspecified: Secondary | ICD-10-CM | POA: Diagnosis not present

## 2017-04-16 DIAGNOSIS — F411 Generalized anxiety disorder: Secondary | ICD-10-CM | POA: Diagnosis not present

## 2017-04-16 DIAGNOSIS — F4310 Post-traumatic stress disorder, unspecified: Secondary | ICD-10-CM | POA: Diagnosis not present

## 2017-04-23 DIAGNOSIS — F411 Generalized anxiety disorder: Secondary | ICD-10-CM | POA: Diagnosis not present

## 2017-04-23 DIAGNOSIS — F4310 Post-traumatic stress disorder, unspecified: Secondary | ICD-10-CM | POA: Diagnosis not present

## 2017-05-14 DIAGNOSIS — F4310 Post-traumatic stress disorder, unspecified: Secondary | ICD-10-CM | POA: Diagnosis not present

## 2017-05-14 DIAGNOSIS — F411 Generalized anxiety disorder: Secondary | ICD-10-CM | POA: Diagnosis not present

## 2017-05-21 DIAGNOSIS — F4310 Post-traumatic stress disorder, unspecified: Secondary | ICD-10-CM | POA: Diagnosis not present

## 2017-05-21 DIAGNOSIS — F411 Generalized anxiety disorder: Secondary | ICD-10-CM | POA: Diagnosis not present

## 2017-05-24 ENCOUNTER — Ambulatory Visit (INDEPENDENT_AMBULATORY_CARE_PROVIDER_SITE_OTHER): Payer: Federal, State, Local not specified - PPO | Admitting: Neurology

## 2017-05-24 ENCOUNTER — Encounter: Payer: Self-pay | Admitting: Neurology

## 2017-05-24 VITALS — BP 133/80 | HR 88 | Ht 60.0 in | Wt 168.0 lb

## 2017-05-24 DIAGNOSIS — G43719 Chronic migraine without aura, intractable, without status migrainosus: Secondary | ICD-10-CM | POA: Diagnosis not present

## 2017-05-24 MED ORDER — METHOCARBAMOL 500 MG PO TABS: 500.0000 mg | ORAL_TABLET | Freq: Four times a day (QID) | ORAL | 6 refills | Status: DC | PRN

## 2017-05-24 NOTE — Patient Instructions (Signed)
Remember to drink plenty of fluid, eat healthy meals and do not skip any meals. Try to eat protein with a every meal and eat a healthy snack such as fruit or nuts in between meals. Try to keep a regular sleep-wake schedule and try to exercise daily, particularly in the form of walking, 20-30 minutes a day, if you can.   As far as your medications are concerned, I would like to suggest:   Robaxin up to 4x a day Amitriptyline 20mg  at bedtime Botox for migraine  I would like to see you back in for botox, sooner if we need to. Please call us with any interim questions, concerns, problems, updates or refill requests.   Our phone number is [phone]. We also have an after hours call service for urgent matters and there is a physician on-call for urgent questions. For any emergencies you know to call 911 or go to the nearest emergency room

## 2017-05-24 NOTE — Progress Notes (Signed)
[facility]    Provider:  Dr Jaynee Eagles Referring Provider: Maurice Small, MD Primary Care Physician:  Maurice Small, MD  CC:  "Migraines, sciatic nerve, cervical neuralgia, right and left arm radiculopathy)  Interval history 05/24/2017: Patient is here for follow-up of multiple neurologic symptoms including radiculopathy, headaches and migraines, chronic body pain especially of the cervical musculature, cervical neuralgia, arm weakness. She was started on amitriptyline at bedtime to help with the above symptoms as well as her insomnia. MRI of the brain was obtained which was unremarkable.  She is on cymbalta, gabapentin and amitriptyline. She stopped the THC. She had 4 sessions at [facility]. She started cpap from the [place] but having difficulty with the mask. She haas daily headache and 8-15 migraine days a month. No aura. No medication overuse. Ongoing for 6 months or longer.Headaches are pounding, unilateral with light/sound sensitivity and nausea. She has failed multiple medications.  Meds tried: Topamax, imitrex (which made her feel bad like she was wound up all the time, increased anxiety), cymbalta (just started 60 mg), gabapentin 200mg  (can't tolerate during the day), flexeril, Amitriptyline  HPI:  Veronica Owen is a 46 y.o. female here as a referral from Dr. Justin Mend for worsening radiculopathy. Past medical history  migraine, depression, anxiety, fibromyalgia, obesity, fibromyalgia brain fog, PTSD. She says today her headaches are the main concern. She has had headaches since the 90s in the [employer]. Initially would "come and go" and would produce anxiety and depression. Stress is now a trigger as is her fibromyalgia with whole body pain and then migraines and headaches. Her head feels heavy and "stuffed", a lot of pressure. She has a lot of cervical muscle pain, starts more on hte right and right side of the head, a lot of pressure on the top and sides and frontal area  as well as on the back of the head. She has the headaches 2-3x a week and they can last several days each so basically every day . Started worsening the beginning of 2016. She has headaches 8-15 migrainous headache days a month. Taking a break or coffee helps. She has light and sound sensitivity and smell triggers, dark rooms help. She has nausea but no vomiting. Mother had migraines. She has vision changes, her vision is worsening especially with a headache she has acute vision worsening. She has numbness and tingling in the arms since 2012. Worsening in frequency and severity. She has been diagnosed with cervical neuralgia. She has weakness sin the arms, she can;t open a water bottle to grip it. Even driving sometimes to hold her arms up can be difficult. She has a lot of pain when she sleeps with insomnia. She has had an emg/ncs which was negative in 2015 as well as an MRI of the cervical spine. She has sciatica as well.   Meds tried: Topamax, imitrex (which made her feel bad like she was wound up all the time, increased anxiety), cymbalta (just started 60 mg), gabapentin 200mg  (can't tolerate during the day), flexeril.  Reviewed notes, labs and imaging from outside physicians, which showed:  Reviewed 32 pages from the [place]:   Labs collected 03/31/2016 include normal CMP with creatinine 0.937, BUN 9, vitamin D low 15.85, TSH 1.56, cholesterol 190, triglycerides 158, LDL 91, normal LFTs, HIV negative hep C negative, EKG with tachycardia sinus otherwise normal, CBC 12/19/2014 was unremarkable. Negative urine drug screen 11/28/2014. Sedimentation rate 10/17/2014 was 30, CCP IgG antibodies less than 16, ANA negative, anti-chromatin antibody negative,  anti-scleroderma 70 antibody negative anticentromere B antibodies negative, Smith RNP antibody negative, anti-ribosomal P protein negative, anti-RNP negative, specifically antibody negative, Sjogren's antibody negative SSB and SSA, anti-Jo 1 negative  anti-double-stranded DNA negative, rheumatoid factor less than 10 which is normal. Vitamin B12 on November 2018 was normal for 63.  MRI of the lumbar spine: 04/23/2016 showed mild lower lumbar discogenic degenerative disease specifically a broad small central disc protrusion at L4-L5 with mild bilateral facet arthropathy and mild bilateral foraminal narrowing. At L5-S1 there is a broad moderate central disc protrusion with associated annular fissure. There is mild bilateral facet arthropathy. There is mild bilateral foraminal narrowing. There is no canal stenosis. The disc protrusion appears slightly increased in size compared with 2015. Otherwise no interval change. No paraspinal abnormalities seen. There is no canal stenosis. No nerve root pinching.  MRI of the cervical spine 01/31/2015 impression cervical spondylosis with multilevel neural foraminal narrowing specifically C2-C3 subtle and uncovertebral hypertrophy, C3-C4 mild bilateral uncovertebral hypertrophy and neural foraminal encroachment, C4-C5 mild disc height loss, moderate broad disc bulge with moderate to severe uncovertebral hypertrophy, zagopophyseal facets normal, narrowing of the ventral thecal sac, moderate left and near severe right neural foraminal narrowing. C5-C6 mild disc height loss and broad disc bulge, moderate uncovertebral hypertrophy, mild impression on the ventral cord surface, mild zagopophyseal facet arthrosis, moderate bilateral neural foraminal narrowing. C6-C7 mild disc height loss and broad disc bulge, moderate uncovertebral hypertrophy, mild bilateral neural foraminal narrowing, C7-T1 central disc bulge facets normal.  Review of Systems: Patient complains of symptoms per HPI as well as the following symptoms: headache,no CP, no SOB, +Neck pain and muscle pain . Pertinent negatives and positives per HPI. All others negative.   Social History   Social History  . Marital status: Single    Spouse name: N/A  .  Number of children: 1  . Years of education: Doctoral   Occupational History  . [employer]    Social History Main Topics  . Smoking status: Never Smoker  . Smokeless tobacco: Never Used  . Alcohol use Yes     Comment: 5-8 oz  . Drug use: Yes    Types: Marijuana     Comment: Very rare for pain  . Sexual activity: Not on file   Other Topics Concern  . Not on file   Social History Narrative   Lives with daughter   Caffeine use: 10-20oz (coffee daily)   Tea/soda- ocass    Family History  Problem Relation Age of Onset  . High Cholesterol Mother   . Diabetes Mother   . Hypertension Father   . Anxiety disorder Father   . Depression Father   . Breast cancer Sister     Past Medical History:  Diagnosis Date  . Anxiety   . Arthritis   . Back pain   . Depression   . Headache   . Knee pain bilat  . Neck pain     Past Surgical History:  Procedure Laterality Date  . CESAREAN SECTION  1994    Current Outpatient Prescriptions  Medication Sig Dispense Refill  . amitriptyline (ELAVIL) 10 MG tablet Take 2 tablets (20 mg total) by mouth at bedtime. 60 tablet 6  . amLODipine (NORVASC) 5 MG tablet Take 5 mg by mouth daily.    . cyclobenzaprine (FLEXERIL) 10 MG tablet Take 10 mg by mouth as needed for muscle spasms.    . DULoxetine (CYMBALTA) 60 MG capsule Take 60 mg by mouth daily.    Marland Kitchen  gabapentin (NEURONTIN) 100 MG capsule Take 200 mg by mouth at bedtime.    . naproxen (NAPROSYN) 500 MG tablet Take 500 mg by mouth as needed.    . pantoprazole (PROTONIX) 40 MG tablet Take 40 mg by mouth daily.    . phentermine 37.5 MG capsule Take 1 capsule by mouth daily.    . methocarbamol (ROBAXIN) 500 MG tablet Take 1 tablet (500 mg total) by mouth every 6 (six) hours as needed for muscle spasms. 60 tablet 6   No current facility-administered medications for this visit.    Facility-Administered Medications Ordered in Other Visits  Medication Dose Route Frequency Provider Last  Rate Last Dose  . gadopentetate dimeglumine (MAGNEVIST) injection 17 mL  17 mL Intravenous Once PRN Melvenia Beam, MD        Allergies as of 05/24/2017  . (No Known Allergies)    Vitals: BP 133/80   Pulse 88   Ht 5' (1.524 m)   Wt 168 lb (76.2 kg)   BMI 32.81 kg/m  Last Weight:  Wt Readings from Last 1 Encounters:  05/24/17 168 lb (76.2 kg)   Last Height:   Ht Readings from Last 1 Encounters:  05/24/17 5' (1.524 m)   Physical exam: Exam: Gen: NAD, conversant, well nourised, obese, well groomed                     CV: RRR, no MRG. No Carotid Bruits. No peripheral edema, warm, nontender Eyes: Conjunctivae clear without exudates or hemorrhage  Neuro: Detailed Neurologic Exam  Speech:    Speech is normal; fluent and spontaneous with normal comprehension.  Cognition:    The patient is oriented to person, place, and time;     recent and remote memory intact;     language fluent;     normal attention, concentration,     fund of knowledge Cranial Nerves:    The pupils are equal, round, and reactive to light. The fundi are normal and spontaneous venous pulsations are present. Visual fields are full to finger confrontation. Extraocular movements are intact. Trigeminal sensation is intact and the muscles of mastication are normal. The face is symmetric. The palate elevates in the midline. Hearing intact. Voice is normal. Shoulder shrug is normal. The tongue has normal motion without fasciculations.   Coordination:    Normal finger to nose and heel to shin. Normal rapid alternating movements.   Gait:    Heel-toe and tandem gait are normal.   Motor Observation:    No asymmetry, no atrophy, and no involuntary movements noted. Tone:    Normal muscle tone.    Posture:    Posture is normal. normal erect    Strength:    Strength is V/V in the upper and lower limbs.      Sensation: intact to LT     Reflex Exam:  DTR's:    Deep tendon reflexes in the upper and lower  extremities are normal bilaterally.   Toes:    The toes are downgoing bilaterally.   Clonus:    Clonus is absent.   Assessment/Plan:  46 y.o. female here as a referral from Dr. Justin Mend for worsening radiculopathy. Past medical history hypertension, migraine, depression, anxiety, fibromyalgia, obesity, fibromyalgia brain fog, PTSD. She has chronic migraines, cervicalgia, musculoskeletal neck pain, cervical radiculopathy and sciatica.   - Emg/ncs of bilateral upper extremities and right leg to eval for cervical radiculopathy and sciatic neuropathy/lumbar radiculopathy was normal - Patient has hypertrophied right trapezius  and other cervical muscles that can be contributing to her migraine and cervicalgia. She does have a lot of musculoskeletal neck pain, chronic pain and fibromyalgia as well as sciatica. Refered her to [facility] for physical therapy, deep tissue massage, acupuncture, stress and pain management, biofeedback. - MRI of the brain given her chronic headaches worse supine/positionally, vision/hearing changes to evaluate for other intracranial abnormalities that can cause headaches such as lesions or intracranial hypertension was unremarkable - As far as your medications are concerned, I would like to suggest: Continue Amitriptyline 20mg  at night - Botox for migraine, will request - Robaxin (do not use with flexeril)  Discussed: To prevent or relieve headaches, try the following: Cool Compress. Lie down and place a cool compress on your head.  Avoid headache triggers. If certain foods or odors seem to have triggered your migraines in the past, avoid them. A headache diary might help you identify triggers.  Include physical activity in your daily routine. Try a daily walk or other moderate aerobic exercise.  Manage stress. Find healthy ways to cope with the stressors, such as delegating tasks on your to-do list.  Practice relaxation techniques. Try deep breathing, yoga, massage  and visualization.  Eat regularly. Eating regularly scheduled meals and maintaining a healthy diet might help prevent headaches. Also, drink plenty of fluids.  Follow a regular sleep schedule. Sleep deprivation might contribute to headaches Consider biofeedback. With this mind-body technique, you learn to control certain bodily functions - such as muscle tension, heart rate and blood pressure - to prevent headaches or reduce headache pain.    Proceed to emergency room if you experience new or worsening symptoms or symptoms do not resolve, if you have new neurologic symptoms or if headache is severe, or for any concerning symptom.   Provided education and documentation from American headache Society toolbox including articles on: chronic migraine medication overuse headache, chronic migraines, prevention of migraines, behavioral and other nonpharmacologic treatments for headache.    Sarina Ill, MD  [facility] [address] [place], [place] [address]  Phone [phone] Fax [phone]  A total of 30 minutes was spent in with this patient face-to-face. Over half this time was spent on counseling patient on the chronic migraine diagnosis and different therapeutic options available.

## 2017-06-10 ENCOUNTER — Ambulatory Visit (INDEPENDENT_AMBULATORY_CARE_PROVIDER_SITE_OTHER): Payer: Federal, State, Local not specified - PPO | Admitting: Neurology

## 2017-06-10 VITALS — BP 140/95 | HR 104 | Ht 59.0 in | Wt 164.2 lb

## 2017-06-10 DIAGNOSIS — G43719 Chronic migraine without aura, intractable, without status migrainosus: Secondary | ICD-10-CM

## 2017-06-10 MED ORDER — ELETRIPTAN HYDROBROMIDE 40 MG PO TABS: 40.0000 mg | ORAL_TABLET | ORAL | 6 refills | Status: DC | PRN

## 2017-06-10 NOTE — Progress Notes (Signed)
Botox 100 units/vial x 2 vials specialty pharmacy [facility] [phone] Lot [id number] Exp 03 2021  Diluted in 4 ml of Bacteriostatic 0.9% NaCl NDC 0044-7158-06 Lot [id number]

## 2017-06-10 NOTE — Progress Notes (Signed)

## 2017-06-18 DIAGNOSIS — F411 Generalized anxiety disorder: Secondary | ICD-10-CM | POA: Diagnosis not present

## 2017-06-18 DIAGNOSIS — F4310 Post-traumatic stress disorder, unspecified: Secondary | ICD-10-CM | POA: Diagnosis not present

## 2017-06-25 DIAGNOSIS — F411 Generalized anxiety disorder: Secondary | ICD-10-CM | POA: Diagnosis not present

## 2017-06-25 DIAGNOSIS — F4310 Post-traumatic stress disorder, unspecified: Secondary | ICD-10-CM | POA: Diagnosis not present

## 2017-07-09 DIAGNOSIS — E669 Obesity, unspecified: Secondary | ICD-10-CM | POA: Diagnosis not present

## 2017-07-09 DIAGNOSIS — F411 Generalized anxiety disorder: Secondary | ICD-10-CM | POA: Diagnosis not present

## 2017-07-09 DIAGNOSIS — F4310 Post-traumatic stress disorder, unspecified: Secondary | ICD-10-CM | POA: Diagnosis not present

## 2017-08-06 DIAGNOSIS — F4310 Post-traumatic stress disorder, unspecified: Secondary | ICD-10-CM | POA: Diagnosis not present

## 2017-08-06 DIAGNOSIS — F411 Generalized anxiety disorder: Secondary | ICD-10-CM | POA: Diagnosis not present

## 2017-08-31 DIAGNOSIS — G8929 Other chronic pain: Secondary | ICD-10-CM | POA: Diagnosis not present

## 2017-08-31 DIAGNOSIS — M545 Low back pain: Secondary | ICD-10-CM | POA: Diagnosis not present

## 2017-08-31 DIAGNOSIS — Z1321 Encounter for screening for nutritional disorder: Secondary | ICD-10-CM | POA: Diagnosis not present

## 2017-08-31 DIAGNOSIS — M542 Cervicalgia: Secondary | ICD-10-CM | POA: Diagnosis not present

## 2017-09-03 DIAGNOSIS — F411 Generalized anxiety disorder: Secondary | ICD-10-CM | POA: Diagnosis not present

## 2017-09-03 DIAGNOSIS — F4310 Post-traumatic stress disorder, unspecified: Secondary | ICD-10-CM | POA: Diagnosis not present

## 2017-09-16 ENCOUNTER — Ambulatory Visit: Payer: Federal, State, Local not specified - PPO | Admitting: Neurology

## 2017-09-24 DIAGNOSIS — F4310 Post-traumatic stress disorder, unspecified: Secondary | ICD-10-CM | POA: Diagnosis not present

## 2017-09-24 DIAGNOSIS — F411 Generalized anxiety disorder: Secondary | ICD-10-CM | POA: Diagnosis not present

## 2017-10-15 DIAGNOSIS — F411 Generalized anxiety disorder: Secondary | ICD-10-CM | POA: Diagnosis not present

## 2017-10-15 DIAGNOSIS — F4310 Post-traumatic stress disorder, unspecified: Secondary | ICD-10-CM | POA: Diagnosis not present

## 2017-10-29 DIAGNOSIS — F411 Generalized anxiety disorder: Secondary | ICD-10-CM | POA: Diagnosis not present

## 2017-10-29 DIAGNOSIS — F4310 Post-traumatic stress disorder, unspecified: Secondary | ICD-10-CM | POA: Diagnosis not present

## 2017-11-30 DIAGNOSIS — F988 Other specified behavioral and emotional disorders with onset usually occurring in childhood and adolescence: Secondary | ICD-10-CM | POA: Diagnosis not present

## 2017-12-06 ENCOUNTER — Telehealth: Payer: Self-pay | Admitting: Neurology

## 2017-12-06 NOTE — Telephone Encounter (Signed)
Pt called to verify appt tomorrow. I advised her the appt had been c/a bc Danielle was unable to get in touch with her. I verified her phone number, it is correct. Please call to advise

## 2017-12-07 ENCOUNTER — Ambulatory Visit: Payer: Federal, State, Local not specified - PPO | Admitting: Neurology

## 2017-12-07 NOTE — Telephone Encounter (Signed)
I called the patient to discuss the cancellation. She did not answer so I left a VM asking her to call me back.

## 2017-12-10 DIAGNOSIS — F4310 Post-traumatic stress disorder, unspecified: Secondary | ICD-10-CM | POA: Diagnosis not present

## 2017-12-10 DIAGNOSIS — F411 Generalized anxiety disorder: Secondary | ICD-10-CM | POA: Diagnosis not present

## 2017-12-21 DIAGNOSIS — K0252 Dental caries on pit and fissure surface penetrating into dentin: Secondary | ICD-10-CM | POA: Diagnosis not present

## 2017-12-22 ENCOUNTER — Telehealth: Payer: Self-pay | Admitting: Neurology

## 2017-12-22 NOTE — Telephone Encounter (Signed)
[email or web address] has called to schedule Botox delivery please call [phone]

## 2017-12-23 NOTE — Telephone Encounter (Signed)
I called the number to schedule the patients medication. They informed me that the medication was not ready to be scheduled. They will call the office back.

## 2017-12-28 DIAGNOSIS — F988 Other specified behavioral and emotional disorders with onset usually occurring in childhood and adolescence: Secondary | ICD-10-CM | POA: Diagnosis not present

## 2018-01-07 DIAGNOSIS — F411 Generalized anxiety disorder: Secondary | ICD-10-CM | POA: Diagnosis not present

## 2018-01-07 DIAGNOSIS — F4310 Post-traumatic stress disorder, unspecified: Secondary | ICD-10-CM | POA: Diagnosis not present

## 2018-01-10 ENCOUNTER — Ambulatory Visit: Payer: Federal, State, Local not specified - PPO | Admitting: Neurology

## 2018-01-10 ENCOUNTER — Encounter: Payer: Self-pay | Admitting: Neurology

## 2018-01-10 ENCOUNTER — Telehealth: Payer: Self-pay | Admitting: Neurology

## 2018-01-10 VITALS — BP 144/92 | HR 112

## 2018-01-10 DIAGNOSIS — Z30433 Encounter for removal and reinsertion of intrauterine contraceptive device: Secondary | ICD-10-CM | POA: Diagnosis not present

## 2018-01-10 DIAGNOSIS — G43719 Chronic migraine without aura, intractable, without status migrainosus: Secondary | ICD-10-CM

## 2018-01-10 NOTE — Progress Notes (Signed)
Botox- 100 units x 2 vials Lot: [id number] Expiration: 06/2020 NDC: 0086-7619-50  Bacteriostatic 0.9% Sodium Chloride- 23mL total Lot: [id number] Expiration: 03/10/2019 NDC: 2458-0998-33  Dx: A25.053 S/P  //BCrn

## 2018-01-10 NOTE — Telephone Encounter (Signed)
Pt. Needs 12 wk Botox inj  °

## 2018-01-11 NOTE — Progress Notes (Signed)
Patient received > 50% improvement in migraine frequency after last botox injection  Consent Form Botulism Toxin Injection For Chronic Migraine  Botulism toxin has been approved by the Federal drug administration for treatment of chronic migraine. Botulism toxin does not cure chronic migraine and it may not be effective in some patients.  The administration of botulism toxin is accomplished by injecting a small amount of toxin into the muscles of the neck and head. Dosage must be titrated for each individual. Any benefits resulting from botulism toxin tend to wear off after 3 months with a repeat injection required if benefit is to be maintained. Injections are usually done every 3-4 months with maximum effect peak achieved by about 2 or 3 weeks. Botulism toxin is expensive and you should be sure of what costs you will incur resulting from the injection.  The side effects of botulism toxin use for chronic migraine may include:   -Transient, and usually mild, facial weakness with facial injections  -Transient, and usually mild, head or neck weakness with head/neck injections  -Reduction or loss of forehead facial animation due to forehead muscle              weakness  -Eyelid drooping  -Dry eye  -Pain at the site of injection or bruising at the site of injection  -Double vision  -Potential unknown long term risks  Contraindications: You should not have Botox if you are pregnant, nursing, allergic to albumin, have an infection, skin condition, or muscle weakness at the site of the injection, or have myasthenia gravis, Lambert-Eaton syndrome, or ALS.  It is also possible that as with any injection, there may be an allergic reaction or no effect from the medication. Reduced effectiveness after repeated injections is sometimes seen and rarely infection at the injection site may occur. All care will be taken to prevent these side effects. If therapy is given over a long time, atrophy and wasting  in the muscle injected may occur. Occasionally the patient's become refractory to treatment because they develop antibodies to the toxin. In this event, therapy needs to be modified.  I have read the above information and consent to the administration of botulism toxin.    ______________  _____   _________________  Patient signature     Date   Witness signature       BOTOX PROCEDURE NOTE FOR MIGRAINE HEADACHE    Contraindications and precautions discussed with patient(above). Aseptic procedure was observed and patient tolerated procedure. Procedure performed by Dr. Georgia Dom  The condition has existed for more than 6 months, and pt does not have a diagnosis of ALS, Myasthenia Gravis or Lambert-Eaton Syndrome. Risks and benefits of injections discussed and pt agrees to proceed with the procedure. Written consent obtained  These injections are medically necessary. He receives good benefits from these injections. These injections do not cause sedations or hallucinations which the oral therapies may cause.  Indication/Diagnosis: chronic migraine BOTOX(J0585) injection was performed according to protocol by [employer]. 200 units of BOTOX was dissolved into 4 cc NS.  NDC: [address]-38  Type of toxin: Botox  Description of procedure:  The patient was placed in a sitting position. The standard protocol was used for Botox as follows, with 5 units of Botox injected at each site:   -Procerus muscle, midline injection  -Corrugator muscle, bilateral injection  -Frontalis muscle, bilateral injection, with 2 sites each side, medial injection was performed in the upper one third of the frontalis muscle, in the region  vertical from the medial inferior edge of the superior orbital rim. The lateral injection was again in the upper one third of the forehead vertically above the lateral limbus of the cornea, 1.5 cm lateral to the medial injection site.  -Temporalis muscle injection, 4 sites,  bilaterally. The first injection was 3 cm above the tragus of the ear, second injection site was 1.5 cm to 3 cm up from the first injection site in line with the tragus of the ear. The third injection site was 1.5-3 cm forward between the first 2 injection sites. The fourth injection site was 1.5 cm posterior to the second injection site.  -Occipitalis muscle injection, 3 sites, bilaterally. The first injection was done one half way between the occipital protuberance and the tip of the mastoid process behind the ear. The second injection site was done lateral and superior to the first, 1 fingerbreadth from the first injection. The third injection site was 1 fingerbreadth superiorly and medially from the first injection site.  -Cervical paraspinal muscle injection, 2 sites, bilateral knee first injection site was 1 cm from the midline of the cervical spine, 3 cm inferior to the lower border of the occipital protuberance. The second injection site was 1.5 cm superiorly and laterally to the first injection site.  -Trapezius muscle injection was performed at 3 sites, bilaterally. The first injection site was in the upper trapezius muscle halfway between the inflection point of the neck, and the acromion. The second injection site was one half way between the acromion and the first injection site. The third injection was done between the first injection site and the inflection point of the neck.   Will return for repeat injection in 3 months.   A 200 unit sof Botox was used, 155 units were injected, the rest of the Botox was wasted. The patient tolerated the procedure well, there were no complications of the above procedure.

## 2018-01-12 NOTE — Telephone Encounter (Signed)
Patient is schedule for 04/18/18 with Dr.Ahern for her botox.

## 2018-01-13 DIAGNOSIS — Z30433 Encounter for removal and reinsertion of intrauterine contraceptive device: Secondary | ICD-10-CM | POA: Diagnosis not present

## 2018-01-21 DIAGNOSIS — F411 Generalized anxiety disorder: Secondary | ICD-10-CM | POA: Diagnosis not present

## 2018-01-21 DIAGNOSIS — F4310 Post-traumatic stress disorder, unspecified: Secondary | ICD-10-CM | POA: Diagnosis not present

## 2018-01-26 DIAGNOSIS — R030 Elevated blood-pressure reading, without diagnosis of hypertension: Secondary | ICD-10-CM | POA: Diagnosis not present

## 2018-01-26 DIAGNOSIS — R9431 Abnormal electrocardiogram [ECG] [EKG]: Secondary | ICD-10-CM | POA: Diagnosis not present

## 2018-01-26 DIAGNOSIS — R000 Tachycardia, unspecified: Secondary | ICD-10-CM | POA: Diagnosis not present

## 2018-01-26 DIAGNOSIS — I252 Old myocardial infarction: Secondary | ICD-10-CM | POA: Diagnosis not present

## 2018-02-03 DIAGNOSIS — G4733 Obstructive sleep apnea (adult) (pediatric): Secondary | ICD-10-CM | POA: Diagnosis not present

## 2018-02-04 DIAGNOSIS — K589 Irritable bowel syndrome without diarrhea: Secondary | ICD-10-CM | POA: Diagnosis not present

## 2018-02-15 DIAGNOSIS — K0262 Dental caries on smooth surface penetrating into dentin: Secondary | ICD-10-CM | POA: Diagnosis not present

## 2018-02-18 DIAGNOSIS — F411 Generalized anxiety disorder: Secondary | ICD-10-CM | POA: Diagnosis not present

## 2018-02-18 DIAGNOSIS — F4310 Post-traumatic stress disorder, unspecified: Secondary | ICD-10-CM | POA: Diagnosis not present

## 2018-03-03 DIAGNOSIS — I10 Essential (primary) hypertension: Secondary | ICD-10-CM | POA: Diagnosis not present

## 2018-03-03 DIAGNOSIS — R140 Abdominal distension (gaseous): Secondary | ICD-10-CM | POA: Diagnosis not present

## 2018-03-03 DIAGNOSIS — R109 Unspecified abdominal pain: Secondary | ICD-10-CM | POA: Diagnosis not present

## 2018-03-03 DIAGNOSIS — Z30431 Encounter for routine checking of intrauterine contraceptive device: Secondary | ICD-10-CM | POA: Diagnosis not present

## 2018-03-10 DIAGNOSIS — G4733 Obstructive sleep apnea (adult) (pediatric): Secondary | ICD-10-CM | POA: Diagnosis not present

## 2018-03-10 DIAGNOSIS — Z1231 Encounter for screening mammogram for malignant neoplasm of breast: Secondary | ICD-10-CM | POA: Diagnosis not present

## 2018-03-11 DIAGNOSIS — F4310 Post-traumatic stress disorder, unspecified: Secondary | ICD-10-CM | POA: Diagnosis not present

## 2018-03-11 DIAGNOSIS — F411 Generalized anxiety disorder: Secondary | ICD-10-CM | POA: Diagnosis not present

## 2018-03-25 DIAGNOSIS — F411 Generalized anxiety disorder: Secondary | ICD-10-CM | POA: Diagnosis not present

## 2018-03-25 DIAGNOSIS — F4310 Post-traumatic stress disorder, unspecified: Secondary | ICD-10-CM | POA: Diagnosis not present

## 2018-04-06 ENCOUNTER — Telehealth: Payer: Self-pay | Admitting: Neurology

## 2018-04-06 NOTE — Telephone Encounter (Signed)
I called the pharmacy to schedule an order for botox, patient has an outstanding balance and they will not fill medication until its paid. I called and made patient aware.

## 2018-04-11 NOTE — Telephone Encounter (Signed)
I called the pharmacy to schedule medication delivery. The patient has still not paid balance. I called the patient to discuss this with her but she she did not answer so I left a VM asking her to call me back.

## 2018-04-18 ENCOUNTER — Ambulatory Visit (INDEPENDENT_AMBULATORY_CARE_PROVIDER_SITE_OTHER): Payer: Federal, State, Local not specified - PPO | Admitting: Neurology

## 2018-04-18 VITALS — BP 140/94 | HR 110

## 2018-04-18 DIAGNOSIS — M5416 Radiculopathy, lumbar region: Secondary | ICD-10-CM

## 2018-04-18 DIAGNOSIS — G43719 Chronic migraine without aura, intractable, without status migrainosus: Secondary | ICD-10-CM

## 2018-04-18 NOTE — Progress Notes (Signed)
Botox- 100 units x 2 vials Lot: [id number] Expiration: 08/2020 NDC: 2951-8841-66  Bacteriostatic 0.9% Sodium Chloride- 12mL total Lot: [id number] Expiration: 03/10/2019 NDC: 0109-3235-57  Dx: [id number] S/P

## 2018-04-18 NOTE — Progress Notes (Signed)
Consent Form Botulism Toxin Injection For Chronic Migraine  Today we discused lumbar radiculopathy of right leg. Repeat MRi lumbar spine, continued pain in the lumbar area, shootin gpain down the leg, over 4 years, last MRi in 2005, has been to PT, OTC meds, muscle relaxers, she reports weakness of the right leg, pain shooting, feels worse when sitting for long periods. Will order MRI lumbar spine and send order to patient. Discussed options, conservative measures, red flags, no changes in bowel or bladder.   A total of 25 minutes was spent face-to-face with this patient. Over half this time was spent on counseling patient on the lumbar radiculopathy diagnosis and different diagnostic and therapeutic options, counseling and coordination of care, risks ans benefits of management, compliance, or risk factor reduction and education.  This does not include any time spent performing botox for migraines procedure.        +masseters, +temples, high on the forehead (felt she had frontal droop), traps not   Reviewed orally with patient, additionally signature is on file:  Botulism toxin has been approved by the Federal drug administration for treatment of chronic migraine. Botulism toxin does not cure chronic migraine and it may not be effective in some patients.  The administration of botulism toxin is accomplished by injecting a small amount of toxin into the muscles of the neck and head. Dosage must be titrated for each individual. Any benefits resulting from botulism toxin tend to wear off after 3 months with a repeat injection required if benefit is to be maintained. Injections are usually done every 3-4 months with maximum effect peak achieved by about 2 or 3 weeks. Botulism toxin is expensive and you should be sure of what costs you will incur resulting from the injection.  The side effects of botulism toxin use for chronic migraine may include:   -Transient, and usually mild, facial weakness  with facial injections  -Transient, and usually mild, head or neck weakness with head/neck injections  -Reduction or loss of forehead facial animation due to forehead muscle weakness  -Eyelid drooping  -Dry eye  -Pain at the site of injection or bruising at the site of injection  -Double vision  -Potential unknown long term risks  Contraindications: You should not have Botox if you are pregnant, nursing, allergic to albumin, have an infection, skin condition, or muscle weakness at the site of the injection, or have myasthenia gravis, Lambert-Eaton syndrome, or ALS.  It is also possible that as with any injection, there may be an allergic reaction or no effect from the medication. Reduced effectiveness after repeated injections is sometimes seen and rarely infection at the injection site may occur. All care will be taken to prevent these side effects. If therapy is given over a long time, atrophy and wasting in the muscle injected may occur. Occasionally the patient's become refractory to treatment because they develop antibodies to the toxin. In this event, therapy needs to be modified.  I have read the above information and consent to the administration of botulism toxin.    BOTOX PROCEDURE NOTE FOR MIGRAINE HEADACHE    Contraindications and precautions discussed with patient(above). Aseptic procedure was observed and patient tolerated procedure. Procedure performed by Dr. Georgia Dom  The condition has existed for more than 6 months, and pt does not have a diagnosis of ALS, Myasthenia Gravis or Lambert-Eaton Syndrome.  Risks and benefits of injections discussed and pt agrees to proceed with the procedure.  Written consent obtained  These injections  are medically necessary. Pt  receives good benefits from these injections. These injections do not cause sedations or hallucinations which the oral therapies may cause.  Indication/Diagnosis: chronic migraine BOTOX(J0585) injection was  performed according to protocol by [employer]. 200 units of BOTOX was dissolved into 4 cc NS.   NDC: [address]-09   Description of procedure:  The patient was placed in a sitting position. The standard protocol was used for Botox as follows, with 5 units of Botox injected at each site:   -Procerus muscle, midline injection  -Corrugator muscle, bilateral injection  -Frontalis muscle, bilateral injection, with 2 sites each side, medial injection was performed in the upper one third of the frontalis muscle, in the region vertical from the medial inferior edge of the superior orbital rim. The lateral injection was again in the upper one third of the forehead vertically above the lateral limbus of the cornea, 1.5 cm lateral to the medial injection site.  -Temporalis muscle injection, 4 sites, bilaterally. The first injection was 3 cm above the tragus of the ear, second injection site was 1.5 cm to 3 cm up from the first injection site in line with the tragus of the ear. The third injection site was 1.5-3 cm forward between the first 2 injection sites. The fourth injection site was 1.5 cm posterior to the second injection site.  -Occipitalis muscle injection, 3 sites, bilaterally. The first injection was done one half way between the occipital protuberance and the tip of the mastoid process behind the ear. The second injection site was done lateral and superior to the first, 1 fingerbreadth from the first injection. The third injection site was 1 fingerbreadth superiorly and medially from the first injection site.  -Cervical paraspinal muscle injection, 2 sites, bilateral knee first injection site was 1 cm from the midline of the cervical spine, 3 cm inferior to the lower border of the occipital protuberance. The second injection site was 1.5 cm superiorly and laterally to the first injection site.  -Trapezius muscle injection was performed at 3 sites, bilaterally. The first injection site was in the  upper trapezius muscle halfway between the inflection point of the neck, and the acromion. The second injection site was one half way between the acromion and the first injection site. The third injection was done between the first injection site and the inflection point of the neck.   Will return for repeat injection in 3 months.   A 200 unit sof Botox was used, 155 units were injected, the rest of the Botox was wasted. The patient tolerated the procedure well, there were no complications of the above procedure.

## 2018-04-20 ENCOUNTER — Telehealth: Payer: Self-pay | Admitting: Neurology

## 2018-04-20 DIAGNOSIS — M5416 Radiculopathy, lumbar region: Secondary | ICD-10-CM | POA: Insufficient documentation

## 2018-04-20 NOTE — Telephone Encounter (Signed)
[employer] auth: NPR per Dr. Jaynee Eagles the pt wants the order mailed to her for her to take it to the [place]. I spoke to the patient to verify her address to me and I mailed the order.

## 2018-05-17 DIAGNOSIS — M797 Fibromyalgia: Secondary | ICD-10-CM | POA: Diagnosis not present

## 2018-05-27 DIAGNOSIS — F4310 Post-traumatic stress disorder, unspecified: Secondary | ICD-10-CM | POA: Diagnosis not present

## 2018-05-27 DIAGNOSIS — F411 Generalized anxiety disorder: Secondary | ICD-10-CM | POA: Diagnosis not present

## 2018-05-30 DIAGNOSIS — F329 Major depressive disorder, single episode, unspecified: Secondary | ICD-10-CM | POA: Diagnosis not present

## 2018-05-30 DIAGNOSIS — F419 Anxiety disorder, unspecified: Secondary | ICD-10-CM | POA: Diagnosis not present

## 2018-05-30 DIAGNOSIS — I10 Essential (primary) hypertension: Secondary | ICD-10-CM | POA: Diagnosis not present

## 2018-05-30 DIAGNOSIS — R51 Headache: Secondary | ICD-10-CM | POA: Diagnosis not present

## 2018-06-10 DIAGNOSIS — F4310 Post-traumatic stress disorder, unspecified: Secondary | ICD-10-CM | POA: Diagnosis not present

## 2018-06-10 DIAGNOSIS — F411 Generalized anxiety disorder: Secondary | ICD-10-CM | POA: Diagnosis not present

## 2018-07-14 ENCOUNTER — Telehealth: Payer: Self-pay | Admitting: Neurology

## 2018-07-14 NOTE — Telephone Encounter (Signed)
I called [phone] and PA was approved for Botox. PA-No # given  (07/14/19)

## 2018-07-18 NOTE — Telephone Encounter (Signed)
Authorization letter received.

## 2018-07-19 ENCOUNTER — Ambulatory Visit: Payer: Federal, State, Local not specified - PPO | Admitting: Neurology

## 2018-07-19 DIAGNOSIS — G43719 Chronic migraine without aura, intractable, without status migrainosus: Secondary | ICD-10-CM | POA: Diagnosis not present

## 2018-07-19 NOTE — Progress Notes (Signed)
Consent Form Botulism Toxin Injection For Chronic Migraine   +masseters, +temples, high on the forehead (felt she had frontal droop), traps not LS  Reviewed orally with patient, additionally signature is on file:  Botulism toxin has been approved by the Federal drug administration for treatment of chronic migraine. Botulism toxin does not cure chronic migraine and it may not be effective in some patients.  The administration of botulism toxin is accomplished by injecting a small amount of toxin into the muscles of the neck and head. Dosage must be titrated for each individual. Any benefits resulting from botulism toxin tend to wear off after 3 months with a repeat injection required if benefit is to be maintained. Injections are usually done every 3-4 months with maximum effect peak achieved by about 2 or 3 weeks. Botulism toxin is expensive and you should be sure of what costs you will incur resulting from the injection.  The side effects of botulism toxin use for chronic migraine may include:   -Transient, and usually mild, facial weakness with facial injections  -Transient, and usually mild, head or neck weakness with head/neck injections  -Reduction or loss of forehead facial animation due to forehead muscle weakness  -Eyelid drooping  -Dry eye  -Pain at the site of injection or bruising at the site of injection  -Double vision  -Potential unknown long term risks  Contraindications: You should not have Botox if you are pregnant, nursing, allergic to albumin, have an infection, skin condition, or muscle weakness at the site of the injection, or have myasthenia gravis, Lambert-Eaton syndrome, or ALS.  It is also possible that as with any injection, there may be an allergic reaction or no effect from the medication. Reduced effectiveness after repeated injections is sometimes seen and rarely infection at the injection site may occur. All care will be taken to prevent these side effects.  If therapy is given over a long time, atrophy and wasting in the muscle injected may occur. Occasionally the patient's become refractory to treatment because they develop antibodies to the toxin. In this event, therapy needs to be modified.  I have read the above information and consent to the administration of botulism toxin.    BOTOX PROCEDURE NOTE FOR MIGRAINE HEADACHE    Contraindications and precautions discussed with patient(above). Aseptic procedure was observed and patient tolerated procedure. Procedure performed by Dr. Georgia Dom  The condition has existed for more than 6 months, and pt does not have a diagnosis of ALS, Myasthenia Gravis or Lambert-Eaton Syndrome.  Risks and benefits of injections discussed and pt agrees to proceed with the procedure.  Written consent obtained  These injections are medically necessary. Pt  receives good benefits from these injections. These injections do not cause sedations or hallucinations which the oral therapies may cause.  Indication/Diagnosis: chronic migraine BOTOX(J0585) injection was performed according to protocol by [employer]. 200 units of BOTOX was dissolved into 4 cc NS.   NDC: [address]-95   Description of procedure:  The patient was placed in a sitting position. The standard protocol was used for Botox as follows, with 5 units of Botox injected at each site:   -Procerus muscle, midline injection  -Corrugator muscle, bilateral injection  -Frontalis muscle, bilateral injection, with 2 sites each side, medial injection was performed in the upper one third of the frontalis muscle, in the region vertical from the medial inferior edge of the superior orbital rim. The lateral injection was again in the upper one third of the forehead  vertically above the lateral limbus of the cornea, 1.5 cm lateral to the medial injection site.  -Temporalis muscle injection, 4 sites, bilaterally. The first injection was 3 cm above the tragus of the  ear, second injection site was 1.5 cm to 3 cm up from the first injection site in line with the tragus of the ear. The third injection site was 1.5-3 cm forward between the first 2 injection sites. The fourth injection site was 1.5 cm posterior to the second injection site.  -Occipitalis muscle injection, 3 sites, bilaterally. The first injection was done one half way between the occipital protuberance and the tip of the mastoid process behind the ear. The second injection site was done lateral and superior to the first, 1 fingerbreadth from the first injection. The third injection site was 1 fingerbreadth superiorly and medially from the first injection site.  -Cervical paraspinal muscle injection, 2 sites, bilateral knee first injection site was 1 cm from the midline of the cervical spine, 3 cm inferior to the lower border of the occipital protuberance. The second injection site was 1.5 cm superiorly and laterally to the first injection site.  -Trapezius muscle injection was performed at 3 sites, bilaterally. The first injection site was in the upper trapezius muscle halfway between the inflection point of the neck, and the acromion. The second injection site was one half way between the acromion and the first injection site. The third injection was done between the first injection site and the inflection point of the neck.   Will return for repeat injection in 3 months.   A 200 unit sof Botox was used, 155 units were injected, the rest of the Botox was wasted. The patient tolerated the procedure well, there were no complications of the above procedure.

## 2018-07-19 NOTE — Progress Notes (Signed)
Botox- 100 units x 2 vials Lot: [id number] Expiration: 01/2021 NDC: 3403-5248-18  Bacteriostatic 0.9% Sodium Chloride- 26mL total Lot: [id number] Expiration: 08/10/2019 NDC: 1216-2446-95  Dx:[id number] S/P

## 2018-10-19 ENCOUNTER — Ambulatory Visit: Payer: Federal, State, Local not specified - PPO | Admitting: Neurology

## 2018-10-28 DIAGNOSIS — K219 Gastro-esophageal reflux disease without esophagitis: Secondary | ICD-10-CM | POA: Diagnosis not present

## 2018-10-28 DIAGNOSIS — R1310 Dysphagia, unspecified: Secondary | ICD-10-CM | POA: Diagnosis not present

## 2018-11-11 DIAGNOSIS — F411 Generalized anxiety disorder: Secondary | ICD-10-CM | POA: Diagnosis not present

## 2018-11-11 DIAGNOSIS — F4310 Post-traumatic stress disorder, unspecified: Secondary | ICD-10-CM | POA: Diagnosis not present

## 2018-12-02 DIAGNOSIS — F419 Anxiety disorder, unspecified: Secondary | ICD-10-CM | POA: Diagnosis not present

## 2018-12-02 DIAGNOSIS — R9431 Abnormal electrocardiogram [ECG] [EKG]: Secondary | ICD-10-CM | POA: Diagnosis not present

## 2020-10-15 ENCOUNTER — Other Ambulatory Visit (HOSPITAL_COMMUNITY): Payer: Self-pay | Admitting: Surgery

## 2020-10-15 ENCOUNTER — Other Ambulatory Visit: Payer: Self-pay | Admitting: Surgery

## 2020-10-16 ENCOUNTER — Other Ambulatory Visit: Payer: Self-pay | Admitting: Surgery

## 2020-10-16 ENCOUNTER — Other Ambulatory Visit (HOSPITAL_COMMUNITY): Payer: Self-pay | Admitting: Surgery

## 2020-10-16 DIAGNOSIS — Z803 Family history of malignant neoplasm of breast: Secondary | ICD-10-CM

## 2020-10-29 ENCOUNTER — Inpatient Hospital Stay
Admission: RE | Admit: 2020-10-29 | Discharge: 2020-10-29 | Disposition: A | Discharge status: Home / Self Care | Payer: Self-pay | Source: Ambulatory Visit | Attending: Surgery | Admitting: Surgery

## 2020-10-29 ENCOUNTER — Other Ambulatory Visit (HOSPITAL_COMMUNITY): Payer: Self-pay | Admitting: Surgery

## 2020-10-29 ENCOUNTER — Other Ambulatory Visit (HOSPITAL_COMMUNITY): Payer: Self-pay | Admitting: Pediatrics

## 2020-10-29 ENCOUNTER — Encounter (HOSPITAL_COMMUNITY): Payer: Self-pay

## 2020-10-29 ENCOUNTER — Ambulatory Visit (HOSPITAL_COMMUNITY): Admission: RE | Admit: 2020-10-29 | Payer: No Typology Code available for payment source | Source: Ambulatory Visit

## 2020-10-29 DIAGNOSIS — C801 Malignant (primary) neoplasm, unspecified: Secondary | ICD-10-CM

## 2020-10-29 DIAGNOSIS — Z803 Family history of malignant neoplasm of breast: Secondary | ICD-10-CM

## 2020-11-12 ENCOUNTER — Ambulatory Visit (HOSPITAL_COMMUNITY)
Admission: RE | Admit: 2020-11-12 | Discharge: 2020-11-12 | Disposition: A | Discharge status: Home / Self Care | Payer: No Typology Code available for payment source | Source: Ambulatory Visit | Attending: Surgery | Admitting: Surgery

## 2020-11-12 ENCOUNTER — Other Ambulatory Visit: Payer: Self-pay

## 2020-11-12 DIAGNOSIS — Z1231 Encounter for screening mammogram for malignant neoplasm of breast: Secondary | ICD-10-CM | POA: Insufficient documentation

## 2020-11-12 DIAGNOSIS — Z803 Family history of malignant neoplasm of breast: Secondary | ICD-10-CM | POA: Insufficient documentation

## 2020-11-12 MED ORDER — GADOBUTROL 1 MMOL/ML IV SOLN
10.0000 mL | Freq: Once | INTRAVENOUS | Status: AC | PRN
  Administered 2020-11-12: 8 mL via INTRAVENOUS

## 2022-04-08 IMAGING — MR MR BREAST BILAT WO/W CM
8 of 11 series · 29 of 48 positions shown · IV contrast (gadavist)
Comparison: Prior outside mammograms and ultrasounds dated
10/18/2020.

CLINICAL DATA: 49-year-old female with high lifetime risk for
developing breast cancer - for screening breast MRI.

LABS:  None performed today
EXAM:
BILATERAL BREAST MRI WITH AND WITHOUT CONTRAST
TECHNIQUE: Multiplanar, multisequence MR images of both breasts were obtained
prior to and following the intravenous administration of 8 ml of
Gadavist

[Series 2: T2 · axial · 3.0mm · 0.81mm/px · z∈[-123,+72]mm · 3 of 66 slices shown]
[im 1/66]
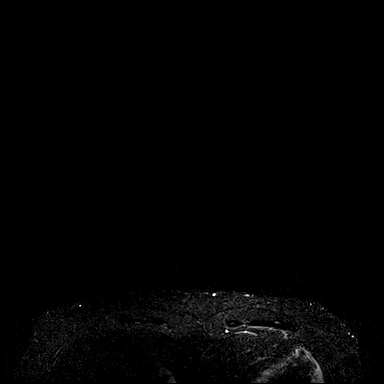
[im 33/66]
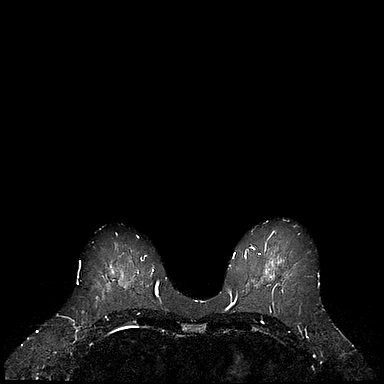
[im 66/66]
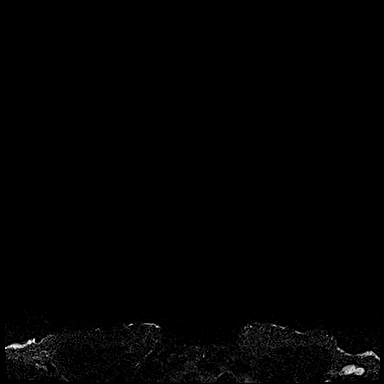

[Series 3: T1 fat-sat · axial · 1.2mm · 0.69mm/px · z∈[-130,+80]mm · 8 of 176 slices shown (1 of 4)]
[im 1/176]
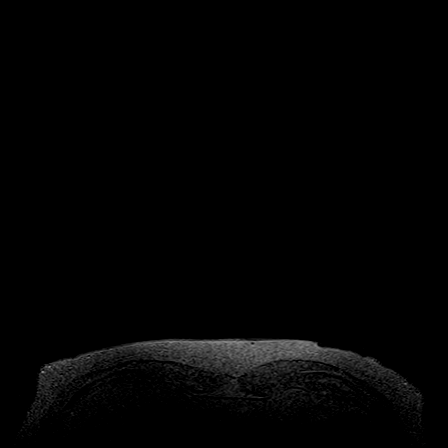
[im 26/176]
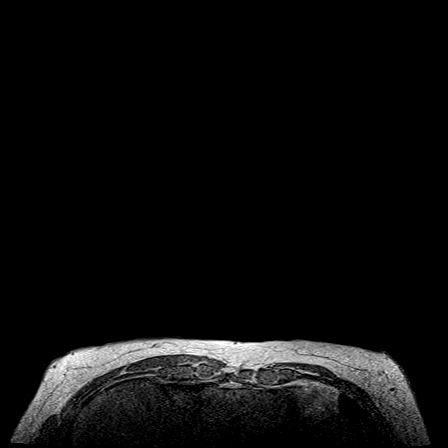
[im 51/176]
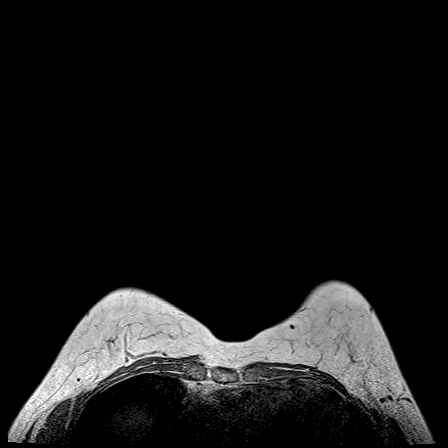
[im 76/176]
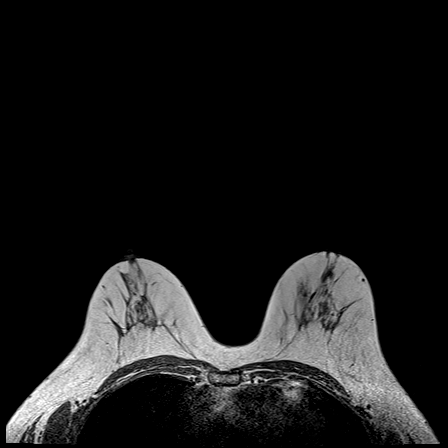
[im 101/176]
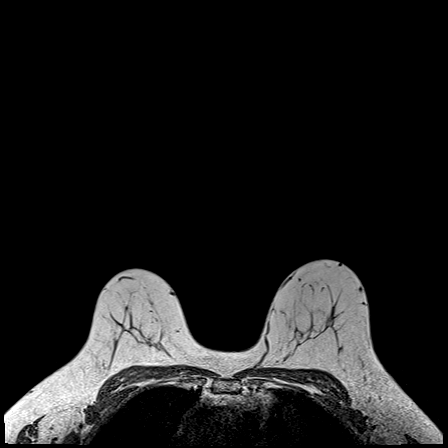
[im 126/176]
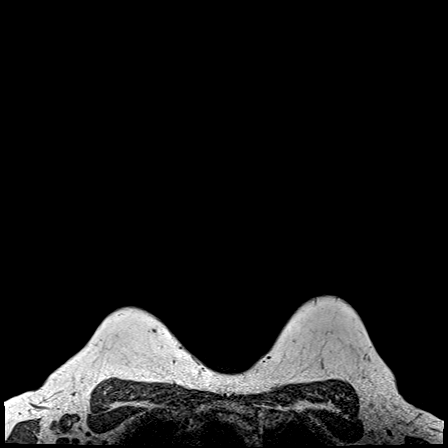
[im 151/176]
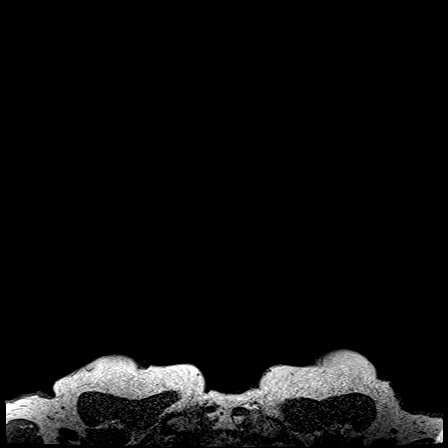
[im 176/176]
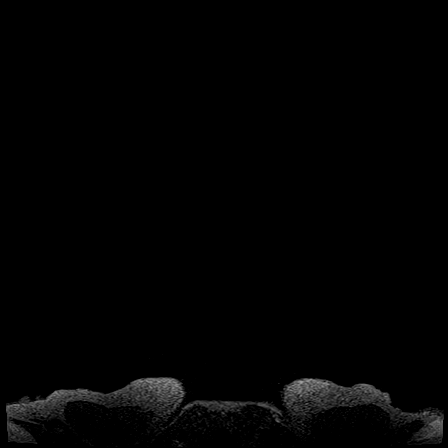

[Series 5: T1 fat-sat · axial · non-contrast · 1.6mm · 0.75mm/px · z∈[-121,+70]mm · 5 of 120 slices shown (2 of 4)]
[im 1/120]
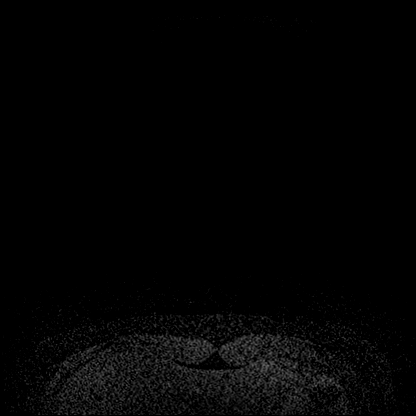
[im 30/120]
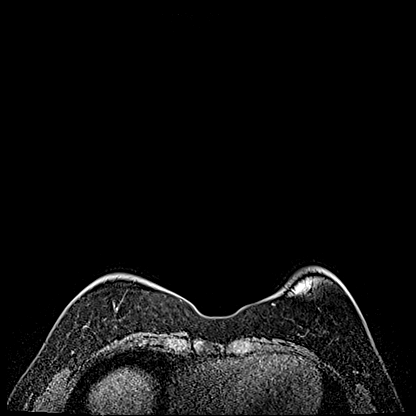
[im 60/120]
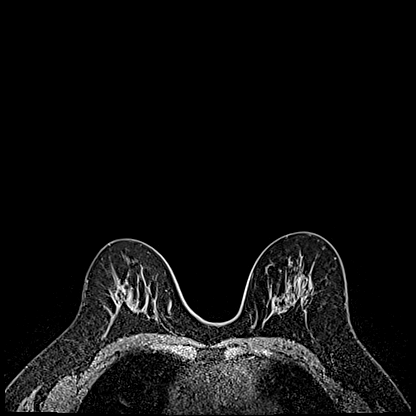
[im 90/120]
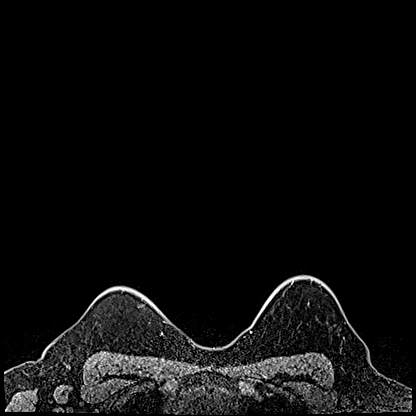
[im 120/120]
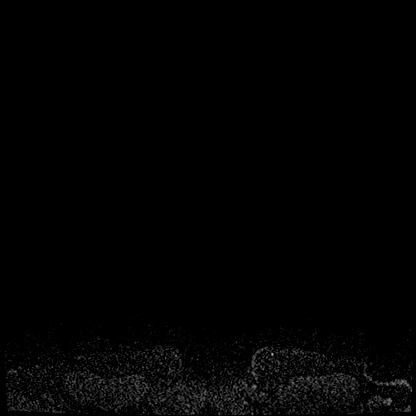

[Series 6: T1 fat-sat · axial · delayed · 1.6mm · 0.75mm/px · z∈[-121,+70]mm · 5 of 120 slices shown (3 of 4)]
[im 1/120]
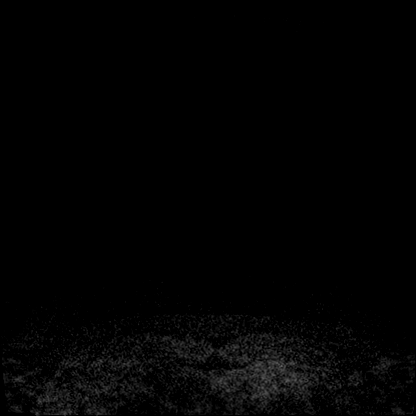
[im 30/120]
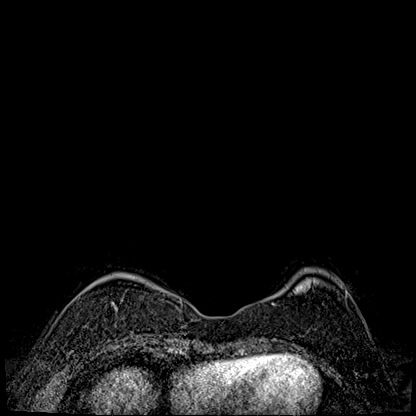
[im 60/120]
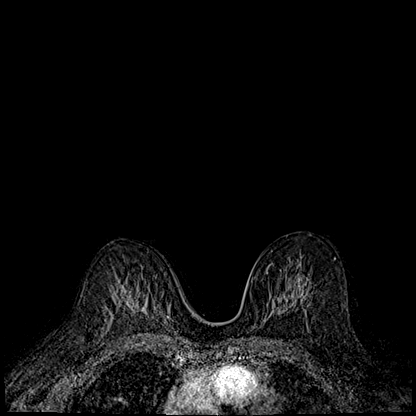
[im 90/120]
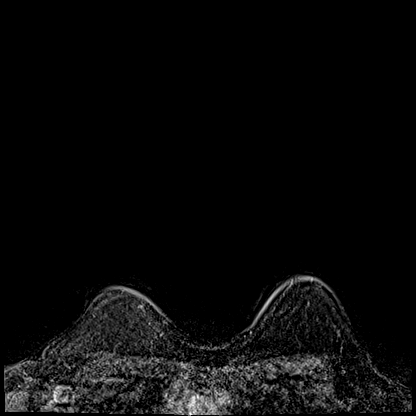
[im 120/120]
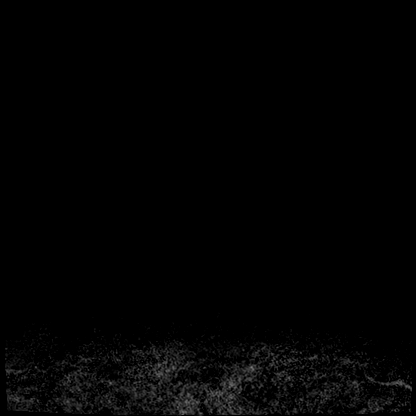

[Series 7: T1 · axial · 1.6mm · 0.75mm/px · z∈[-121,+70]mm · 5 of 120 slices shown (1 of 3)]
[im 1/120]
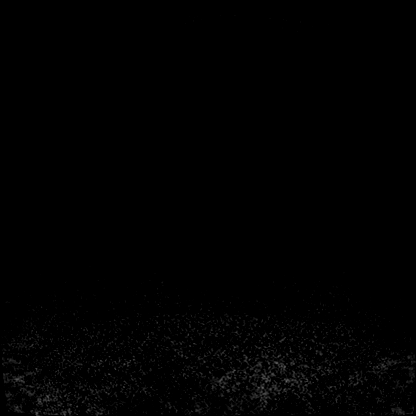
[im 30/120]
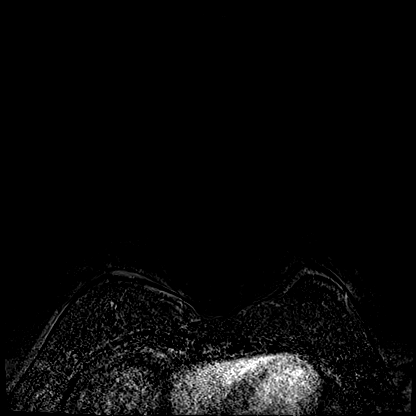
[im 60/120]
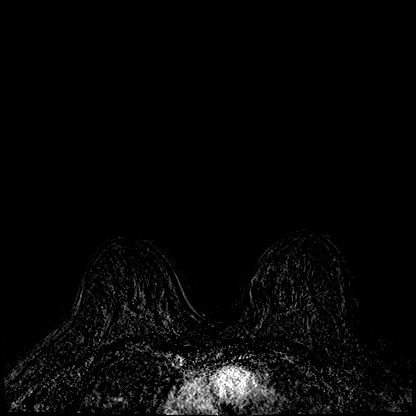
[im 90/120]
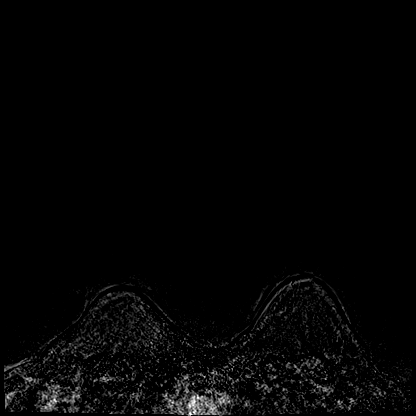
[im 120/120]
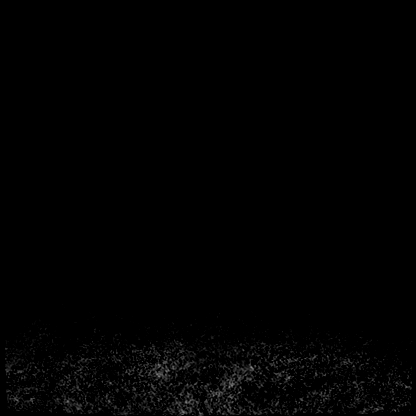

[Series 8: T1 · coronal · 310.0mm · 0.75mm/px · 1 of 3 slices shown (2 of 3)]
[im 1/3]
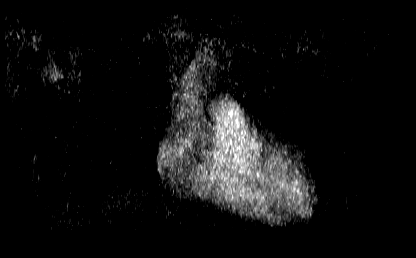

[Series 9: T1 · axial · 192.0mm · 0.75mm/px · 1 of 3 slices shown (3 of 3)]
[im 1/3]
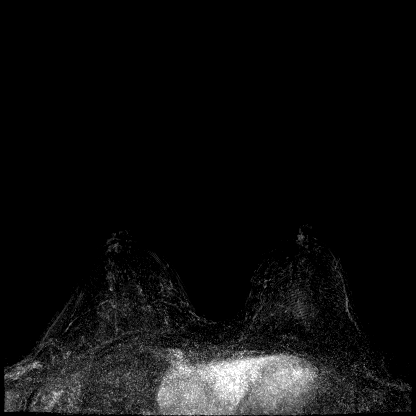

[Series 10: T1 fat-sat · axial · delayed · 1.6mm · 0.75mm/px · 1 of 120 slices shown (4 of 4)]
[im 1/120]
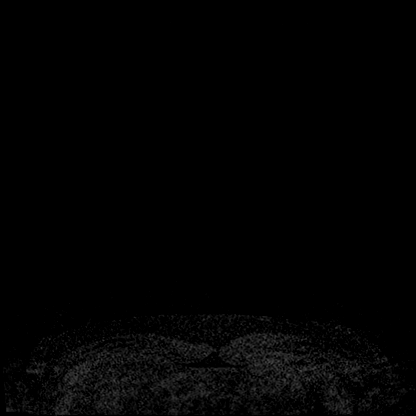

[29 of 48 positions shown; findings below may reference images not displayed]

Three-dimensional MR images were rendered by post-processing of the
original MR data on an independent workstation. The
three-dimensional MR images were interpreted, and findings are
reported in the following complete MRI report for this study. Three
dimensional images were evaluated at the independent interpreting
workstation using the DynaCAD thin client.
FINDINGS: Breast composition: b. Scattered fibroglandular tissue.

Background parenchymal enhancement: Mild

Right breast: No mass or abnormal enhancement.

Left breast: No mass or abnormal enhancement.

Lymph nodes: No suspicious appearing lymph nodes. Mildly prominent
axillary lymph nodes were recently evaluated sonographically.

Ancillary findings:  None.
IMPRESSION: 1. No MRI evidence of breast malignancy. Bilateral screening
mammogram and bilateral breast MRI in 1 year.

RECOMMENDATION:
1. Bilateral screening mammogram in October 2021 to resume annual
mammogram schedule.
2. Bilateral screening breast MRI in 1 year in this high risk
patient.
3. Consider clinical follow-up as indicated. Any further workup
should be based on clinical grounds.

BI-RADS CATEGORY  1: Negative.

## 2022-06-08 ENCOUNTER — Other Ambulatory Visit: Payer: Self-pay | Admitting: Podiatry

## 2022-06-08 ENCOUNTER — Ambulatory Visit (INDEPENDENT_AMBULATORY_CARE_PROVIDER_SITE_OTHER): Payer: No Typology Code available for payment source

## 2022-06-08 ENCOUNTER — Ambulatory Visit (INDEPENDENT_AMBULATORY_CARE_PROVIDER_SITE_OTHER): Payer: No Typology Code available for payment source | Admitting: Podiatry

## 2022-06-08 ENCOUNTER — Ambulatory Visit: Payer: No Typology Code available for payment source

## 2022-06-08 DIAGNOSIS — M21619 Bunion of unspecified foot: Secondary | ICD-10-CM | POA: Diagnosis not present

## 2022-06-08 DIAGNOSIS — M205X9 Other deformities of toe(s) (acquired), unspecified foot: Secondary | ICD-10-CM | POA: Diagnosis not present

## 2022-06-08 DIAGNOSIS — M722 Plantar fascial fibromatosis: Secondary | ICD-10-CM | POA: Diagnosis not present

## 2022-06-08 DIAGNOSIS — B351 Tinea unguium: Secondary | ICD-10-CM

## 2022-06-08 DIAGNOSIS — M79671 Pain in right foot: Secondary | ICD-10-CM

## 2022-06-08 MED ORDER — CICLOPIROX 8 % EX SOLN: Freq: Every day | CUTANEOUS | 2 refills | Status: AC

## 2022-06-08 MED ORDER — MELOXICAM 15 MG PO TABS: 15.0000 mg | ORAL_TABLET | Freq: Every day | ORAL | 0 refills | Status: AC | PRN

## 2022-06-08 NOTE — Patient Instructions (Signed)
You can also use UREA NAIL GEL for the thick toenails in combination with the fungus medication.   Plantar Fasciitis (Heel Spur Syndrome) with Rehab The plantar fascia is a fibrous, ligament-like, soft-tissue structure that spans the bottom of the foot. Plantar fasciitis is a condition that causes pain in the foot due to inflammation of the tissue. SYMPTOMS  Pain and tenderness on the underneath side of the foot. Pain that worsens with standing or walking. CAUSES  Plantar fasciitis is caused by irritation and injury to the plantar fascia on the underneath side of the foot. Common mechanisms of injury include: Direct trauma to bottom of the foot. Damage to a small nerve that runs under the foot where the main fascia attaches to the heel bone. Stress placed on the plantar fascia due to bone spurs. RISK INCREASES WITH:  Activities that place stress on the plantar fascia (running, jumping, pivoting, or cutting). Poor strength and flexibility. Improperly fitted shoes. Tight calf muscles. Flat feet. Failure to warm-up properly before activity. Obesity. PREVENTION Warm up and stretch properly before activity. Allow for adequate recovery between workouts. Maintain physical fitness: Strength, flexibility, and endurance. Cardiovascular fitness. Maintain a health body weight. Avoid stress on the plantar fascia. Wear properly fitted shoes, including arch supports for individuals who have flat feet.  PROGNOSIS  If treated properly, then the symptoms of plantar fasciitis usually resolve without surgery. However, occasionally surgery is necessary.  RELATED COMPLICATIONS  Recurrent symptoms that may result in a chronic condition. Problems of the lower back that are caused by compensating for the injury, such as limping. Pain or weakness of the foot during push-off following surgery. Chronic inflammation, scarring, and partial or complete fascia tear, occurring more often from repeated  injections.  TREATMENT  Treatment initially involves the use of ice and medication to help reduce pain and inflammation. The use of strengthening and stretching exercises may help reduce pain with activity, especially stretches of the Achilles tendon. These exercises may be performed at home or with a therapist. Your caregiver may recommend that you use heel cups of arch supports to help reduce stress on the plantar fascia. Occasionally, corticosteroid injections are given to reduce inflammation. If symptoms persist for greater than 6 months despite non-surgical (conservative), then surgery may be recommended.   MEDICATION  If pain medication is necessary, then nonsteroidal anti-inflammatory medications, such as aspirin and ibuprofen, or other minor pain relievers, such as acetaminophen, are often recommended. Do not take pain medication within 7 days before surgery. Prescription pain relievers may be given if deemed necessary by your caregiver. Use only as directed and only as much as you need. Corticosteroid injections may be given by your caregiver. These injections should be reserved for the most serious cases, because they may only be given a certain number of times.  HEAT AND COLD Cold treatment (icing) relieves pain and reduces inflammation. Cold treatment should be applied for 10 to 15 minutes every 2 to 3 hours for inflammation and pain and immediately after any activity that aggravates your symptoms. Use ice packs or massage the area with a piece of ice (ice massage). Heat treatment may be used prior to performing the stretching and strengthening activities prescribed by your caregiver, physical therapist, or athletic trainer. Use a heat pack or soak the injury in warm water.  SEEK IMMEDIATE MEDICAL CARE IF: Treatment seems to offer no benefit, or the condition worsens. Any medications produce adverse side effects.  EXERCISES- RANGE OF MOTION (ROM) AND STRETCHING EXERCISES -  Plantar  Fasciitis (Heel Spur Syndrome) These exercises may help you when beginning to rehabilitate your injury. Your symptoms may resolve with or without further involvement from your physician, physical therapist or athletic trainer. While completing these exercises, remember:  Restoring tissue flexibility helps normal motion to return to the joints. This allows healthier, less painful movement and activity. An effective stretch should be held for at least 30 seconds. A stretch should never be painful. You should only feel a gentle lengthening or release in the stretched tissue.  RANGE OF MOTION - Toe Extension, Flexion Sit with your right / left leg crossed over your opposite knee. Grasp your toes and gently pull them back toward the top of your foot. You should feel a stretch on the bottom of your toes and/or foot. Hold this stretch for 10 seconds. Now, gently pull your toes toward the bottom of your foot. You should feel a stretch on the top of your toes and or foot. Hold this stretch for 10 seconds. Repeat  times. Complete this stretch 3 times per day.   RANGE OF MOTION - Ankle Dorsiflexion, Active Assisted Remove shoes and sit on a chair that is preferably not on a carpeted surface. Place right / left foot under knee. Extend your opposite leg for support. Keeping your heel down, slide your right / left foot back toward the chair until you feel a stretch at your ankle or calf. If you do not feel a stretch, slide your bottom forward to the edge of the chair, while still keeping your heel down. Hold this stretch for 10 seconds. Repeat 3 times. Complete this stretch 2 times per day.   STRETCH  Gastroc, Standing Place hands on wall. Extend right / left leg, keeping the front knee somewhat bent. Slightly point your toes inward on your back foot. Keeping your right / left heel on the floor and your knee straight, shift your weight toward the wall, not allowing your back to arch. You should feel a  gentle stretch in the right / left calf. Hold this position for 10 seconds. Repeat 3 times. Complete this stretch 2 times per day.  STRETCH  Soleus, Standing Place hands on wall. Extend right / left leg, keeping the other knee somewhat bent. Slightly point your toes inward on your back foot. Keep your right / left heel on the floor, bend your back knee, and slightly shift your weight over the back leg so that you feel a gentle stretch deep in your back calf. Hold this position for 10 seconds. Repeat 3 times. Complete this stretch 2 times per day.  STRETCH  Gastrocsoleus, Standing  Note: This exercise can place a lot of stress on your foot and ankle. Please complete this exercise only if specifically instructed by your caregiver.  Place the ball of your right / left foot on a step, keeping your other foot firmly on the same step. Hold on to the wall or a rail for balance. Slowly lift your other foot, allowing your body weight to press your heel down over the edge of the step. You should feel a stretch in your right / left calf. Hold this position for 10 seconds. Repeat this exercise with a slight bend in your right / left knee. Repeat 3 times. Complete this stretch 2 times per day.   STRENGTHENING EXERCISES - Plantar Fasciitis (Heel Spur Syndrome)  These exercises may help you when beginning to rehabilitate your injury. They may resolve your symptoms with or without  further involvement from your physician, physical therapist or athletic trainer. While completing these exercises, remember:  Muscles can gain both the endurance and the strength needed for everyday activities through controlled exercises. Complete these exercises as instructed by your physician, physical therapist or athletic trainer. Progress the resistance and repetitions only as guided.  STRENGTH - Towel Curls Sit in a chair positioned on a non-carpeted surface. Place your foot on a towel, keeping your heel on the  floor. Pull the towel toward your heel by only curling your toes. Keep your heel on the floor. Repeat 3 times. Complete this exercise 2 times per day.  STRENGTH - Ankle Inversion Secure one end of a rubber exercise band/tubing to a fixed object (table, pole). Loop the other end around your foot just before your toes. Place your fists between your knees. This will focus your strengthening at your ankle. Slowly, pull your big toe up and in, making sure the band/tubing is positioned to resist the entire motion. Hold this position for 10 seconds. Have your muscles resist the band/tubing as it slowly pulls your foot back to the starting position. Repeat 3 times. Complete this exercises 2 times per day.  Document Released: 10/26/2005 Document Revised: 01/18/2012 Document Reviewed: 02/07/2009 [facility] Patient Information 2014 [place], [place].   Bunion A bunion (hallux valgus) is a bump that forms slowly on the inner side of the big toe joint. It occurs when the big toe turns toward the second toe. Bunions may be small at first, but they often get larger over time. They can make walking painful. What are the causes? This condition may be caused by: Wearing narrow or pointed shoes that force the big toe to press against the other toes. Abnormal foot development that causes the foot to roll inward. Changes in the foot that are caused by certain diseases, such as rheumatoid arthritis or polio. A foot injury. What increases the risk? The following factors may make you more likely to develop this condition: Wearing shoes that squeeze the toes together. Having certain diseases, such as: Rheumatoid arthritis. Polio. Cerebral palsy. Having family members who have bunions. Being born with abnormally shaped feet (a foot deformity), such as flat feet or low arches. Doing activities that put a lot of pressure on the feet, such as ballet dancing. What are the signs or symptoms?  The main symptom of  this condition is a bump on your big toe that you can notice. Other symptoms may include: Pain. Redness and inflammation around your big toe. Thick or hardened skin on your big toe or between your toes. Stiffness or loss of motion in your big toe. Trouble with walking. How is this diagnosed? This condition may be diagnosed based on your symptoms, medical history, and activities. You may also have tests and imaging, such as: X-rays. These allow your health care provider to check the position of the bones in your foot and look for damage to your joint. They also help your health care provider determine the severity of your bunion and the best way to treat it. Joint aspiration. In this test, a sample of fluid is removed from the toe joint. This test may be done if you are in a lot of pain. It helps rule out diseases that cause painful swelling of the joints, such as arthritis or gout. How is this treated? Treatment depends on the severity of your symptoms. The goal of treatment is to relieve symptoms and prevent your bunion from getting worse. Your health  care provider may recommend: Wearing shoes that have a wide toe box, or using bunion pads to cushion the affected area. Taping your toes together to keep them in a normal position. Placing a device inside your shoe (orthotic device) to help reduce pressure on your toe joint. Taking medicine to ease pain and inflammation. Putting ice or heat on the affected area. Doing stretching exercises. Surgery, for severe cases. Follow these instructions at home: Managing pain, stiffness, and swelling     If directed, put ice on the painful area. To do this: Put ice in a plastic bag. Place a towel between your skin and the bag. Leave the ice on for 20 minutes, 2-3 times a day. Remove the ice if your skin turns bright red. This is very important. If you cannot feel pain, heat, or cold, you have a greater risk of damage to the area. If directed, apply  heat to the affected area before you exercise. Use the heat source that your health care provider recommends, such as a moist heat pack or a heating pad. Place a towel between your skin and the heat source. Leave the heat on for 20-30 minutes. Remove the heat if your skin turns bright red. This is especially important if you are unable to feel pain, heat, or cold. You have a greater risk of getting burned. General instructions Do exercises as told by your health care provider. Support your toe joint with proper footwear, shoe padding, or taping as told by your health care provider. Take over-the-counter and prescription medicines only as told by your health care provider. Do not use any products that contain nicotine or tobacco, such as cigarettes, e-cigarettes, and chewing tobacco. If you need help quitting, ask your health care provider. Keep all follow-up visits. This is important. Contact a health care provider if: Your symptoms get worse. Your symptoms do not improve in 2 weeks. Get help right away if: You have severe pain and trouble with walking. Summary A bunion is a bump on the inner side of the big toe joint that forms when the big toe turns toward the second toe. Bunions can make walking painful. Treatment depends on the severity of your symptoms. Support your toe joint with proper footwear, shoe padding, or taping as told by your health care provider. This information is not intended to replace advice given to you by your health care provider. Make sure you discuss any questions you have with your health care provider. Document Revised: 03/01/2020 Document Reviewed: 03/01/2020 Elsevier Patient Education  [place].

## 2022-06-08 NOTE — Progress Notes (Unsigned)
Subjective:   Patient ID: Veronica Owen, female   DOB: 51 y.o.   MRN: [id number]   HPI Chief Complaint  Patient presents with   Foot Pain    Bilateral foot pain , pain is mostly at the heel , second on the top of both feet , has been ongoing for years , patient states she is not a diabetic       Patin depends on what shoes she wears. 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 She does not notice pain when relaxing but cannot walk on floors barefoot. Left is worse than right.   Has bunions and occaional numbness in the 2nd toes. States that she does have some neuropathy.   No treatment.   Review of Systems  All other systems reviewed and are negative.  Past Medical History:  Diagnosis Date   Anxiety    Arthritis    Back pain    Depression    Headache    Knee pain bilat   Neck pain     Past Surgical History:  Procedure Laterality Date   CESAREAN SECTION  1994     Current Outpatient Medications:    ciclopirox (PENLAC) 8 % solution, Apply topically at bedtime. Apply over nail and surrounding skin. Apply daily over previous coat. After seven (7) days, may remove with alcohol and continue cycle., Disp: 6.6 mL, Rfl: 2   meloxicam (MOBIC) 15 MG tablet, Take 1 tablet (15 mg total) by mouth daily as needed for pain., Disp: 30 tablet, Rfl: 0   amitriptyline (ELAVIL) 10 MG tablet, Take 2 tablets (20 mg total) by mouth at bedtime., Disp: 60 tablet, Rfl: 6   amLODipine (NORVASC) 5 MG tablet, Take 10 mg by mouth daily. , Disp: , Rfl:    amphetamine-dextroamphetamine (ADDERALL) 10 MG tablet, Take 1 tablet by mouth 2 (two) times daily., Disp: , Rfl:    Cholecalciferol (VITAMIN D PO), Take 2,000 Int'l Units by mouth daily., Disp: , Rfl:    cyclobenzaprine (FLEXERIL) 10 MG tablet, Take 10 mg by mouth as needed for muscle spasms., Disp: , Rfl:    DULoxetine (CYMBALTA) 30 MG capsule, Take 90 mg by mouth daily., Disp: , Rfl:     fluticasone (FLONASE) 50 MCG/ACT nasal spray, Place into both nostrils daily., Disp: , Rfl:    GABAPENTIN PO, Take 200 mg by mouth at bedtime. , Disp: , Rfl:    hydrOXYzine (ATARAX/VISTARIL) 10 MG tablet, Take 10 mg by mouth as needed., Disp: , Rfl:    naproxen (NAPROSYN) 500 MG tablet, Take 500 mg by mouth as needed., Disp: , Rfl:    TRIPROLIDINE-PSEUDOEPHEDRINE PO, Take 1 tablet by mouth every 12 (twelve) hours., Disp: , Rfl:    Vitamin D, Ergocalciferol, (DRISDOL) 50000 units CAPS capsule, Take 50,000 Units by mouth every 7 (seven) days., Disp: , Rfl:  No current facility-administered medications for this visit.  Facility-Administered Medications Ordered in Other Visits:    gadopentetate dimeglumine (MAGNEVIST) injection 17 mL, 17 mL, Intravenous, Once PRN, Melvenia Beam, MD  No Known Allergies        Objective:  Physical Exam  General: AAO x3, NAD  Dermatological: Nails are hypertrophic, dystrophic with yellow, brown discoloration.  No hyperpigmentation.  Vascular: Dorsalis Pedis artery and Posterior Tibial artery pedal pulses are 2/4 bilateral with immedate capillary fill time. There is no pain with calf compression, swelling, warmth, erythema.   Neruologic: Grossly intact via light  touch bilateral.  Sensation intact with Semmes Weinstein monofilament.  Negative Tinel sign.  Musculoskeletal: Moderate bunions present.  There is mallet toe deformity noted of the second digit.  There is tenderness palpation along both these areas.  No crepitation restriction with MPJ range of motion.  No hypermobility.  There is tenderness palpation along the tibial tubercle of the calcaneus at the insertion of plantar fascia.  Plantar fascia peers to be intact.  No significant pain with Achilles tendon.  No area pinpoint tenderness otherwise.  Muscular strength 5/5 in all groups tested bilateral.  Gait: Unassisted, Nonantalgic.       Assessment:   51 year old female with plantar fasciitis,  symptomatic bunion, mallet toe deformity; onychomycosis     Plan:  -Treatment options discussed including all alternatives, risks, and complications. -Etiology of symptoms were discussed -X-rays obtained reviewed.  3 views of bilateral feet were obtained.  No evidence of acute fracture.  Moderate increase in first intermetatarsal angle. -For the nail fungus Penlac was prescribed is also recommended urea nail gel. -For the heel pain we discussed stretching, icing daily.  Discussed modifications and good arch supports.Prescribed mobic. Discussed side effects of the medication and directed to stop if any are to occur and call the office.  We will check orthotic benefit coverage -Regards the bunion, mallet toe we discussed offloading, shoe modifications.  We discussed surgical intervention for the bunion, mallet toe.  We discussed the surgery as postoperative course.  She would like to proceed with this but she can continue with conservative care for now let me know when she wants to proceed with the surgery.    Trula Slade DPM

## 2022-06-11 ENCOUNTER — Telehealth: Payer: Self-pay

## 2022-06-11 NOTE — Telephone Encounter (Signed)
[employer] typically takes 5-10 minutes to respond, but it may take a little longer in some cases. You will be notified by email when available. You can also check for an update later by opening this request from your dashboard. Please do not fax or call [employer] to resubmit this request. If you need assistance, please chat with CoverMyMeds or call us at [phone].

## 2022-06-11 NOTE — Telephone Encounter (Signed)
ciclopirox (PENLAC) 8 % solution    Prior authorization has been started today on 06/11/22.

## 2022-06-11 NOTE — Telephone Encounter (Signed)
Created encounter in error

## 2022-06-19 ENCOUNTER — Ambulatory Visit: Payer: No Typology Code available for payment source

## 2022-06-29 ENCOUNTER — Ambulatory Visit: Payer: No Typology Code available for payment source

## 2022-07-10 ENCOUNTER — Other Ambulatory Visit: Payer: Self-pay | Admitting: Podiatry

## 2023-02-11 ENCOUNTER — Encounter (HOSPITAL_COMMUNITY): Payer: Self-pay | Admitting: Emergency Medicine

## 2023-02-11 ENCOUNTER — Ambulatory Visit (HOSPITAL_COMMUNITY)
Admission: EM | Admit: 2023-02-11 | Discharge: 2023-02-11 | Disposition: A | Discharge status: Home / Self Care | Payer: No Typology Code available for payment source | Attending: Internal Medicine | Admitting: Internal Medicine

## 2023-02-11 DIAGNOSIS — G4486 Cervicogenic headache: Secondary | ICD-10-CM | POA: Diagnosis not present

## 2023-02-11 DIAGNOSIS — I10 Essential (primary) hypertension: Secondary | ICD-10-CM

## 2023-02-11 NOTE — Discharge Instructions (Signed)
Please start your blood pressure medication once you get home The headaches are likely secondary to uncontrolled blood pressure The recent lab work you did is reassuring If you have persistent or worsening headache please return to urgent care to be reevaluated.

## 2023-02-11 NOTE — ED Triage Notes (Signed)
Pt reports a headache and nausea x 2 days.  States past couple days, she had been waking up with a severe headache and dry heaving with no vomiting. 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

## 2023-02-12 NOTE — ED Provider Notes (Signed)
[facility]    CSN: [id number] Arrival date & time: 02/11/23  1217      History   Chief Complaint Chief Complaint  Patient presents with   Headache   Nausea    HPI Veronica Owen is a 52 y.o. female comes to the urgent care with a 2-day history of headache and nausea.  Patient has a history of hypertension on amlodipine.  Head dose of amlodipine was recently increased from 5 mg to 10 mg for better blood pressure control.  She has not started taking amlodipine 10 mg yet.  Headache is global, usually in the mornings with no known aggravating or relieving factors.  No double vision or blurred vision.  No dizziness, near syncope or syncopal episodes.  Positive nausea but no vomiting.  No chest pain or chest pressure.  No abdominal pain.  No fever or chills.  No sick contacts.Marland Kitchen.   HPI  Past Medical History:  Diagnosis Date   Anxiety    Arthritis    Back pain    Depression    Headache    Knee pain bilat   Neck pain     Patient Active Problem List   Diagnosis Date Noted   Lumbar radiculopathy 04/20/2018   Pain 03/04/2017   Chronic migraine w/o aura w/o status migrainosus, not intractable 12/09/2016   Fibromyalgia 12/09/2016   Cervical radiculitis 12/09/2016   Sciatica 12/09/2016   Anxiety 12/09/2016   Depression 12/09/2016    Past Surgical History:  Procedure Laterality Date   CESAREAN SECTION  1994    OB History   No obstetric history on file.      Home Medications    Prior to Admission medications   Medication Sig Start Date End Date Taking? Authorizing Provider  amitriptyline (ELAVIL) 10 MG tablet Take 2 tablets (20 mg total) by mouth at bedtime. 12/09/16   Anson FretAhern, Antonia B, MD  amLODipine (NORVASC) 5 MG tablet Take 10 mg by mouth daily.  10/26/16   [provider]  amphetamine-dextroamphetamine (ADDERALL) 10 MG tablet Take 1 tablet by mouth 2 (two) times daily. 12/01/17   [provider]  Cholecalciferol (VITAMIN D PO) Take 2,000  Int'l Units by mouth daily.    [provider]  ciclopirox (PENLAC) 8 % solution Apply topically at bedtime. Apply over nail and surrounding skin. Apply daily over previous coat. After seven (7) days, may remove with alcohol and continue cycle. 06/08/22   Vivi BarrackWagoner, Matthew R, DPM  cyclobenzaprine (FLEXERIL) 10 MG tablet Take 10 mg by mouth as needed for muscle spasms.    [provider]  DULoxetine (CYMBALTA) 30 MG capsule Take 90 mg by mouth daily.    [provider]  fluticasone (FLONASE) 50 MCG/ACT nasal spray Place into both nostrils daily.    [provider]  GABAPENTIN PO Take 200 mg by mouth at bedtime.     [provider]  hydrOXYzine (ATARAX/VISTARIL) 10 MG tablet Take 10 mg by mouth as needed.    [provider]  meloxicam (MOBIC) 15 MG tablet Take 1 tablet (15 mg total) by mouth daily as needed for pain. 06/08/22 06/08/23  Vivi BarrackWagoner, Matthew R, DPM  naproxen (NAPROSYN) 500 MG tablet Take 500 mg by mouth as needed. 09/08/16   [provider]  TRIPROLIDINE-PSEUDOEPHEDRINE PO Take 1 tablet by mouth every 12 (twelve) hours.    [provider]  Vitamin D, Ergocalciferol, (DRISDOL) 50000 units CAPS capsule Take 50,000 Units by mouth every 7 (seven) days.  [provider]    Family History Family History  Problem Relation Age of Onset   High Cholesterol Mother    Diabetes Mother    Hypertension Father    Anxiety disorder Father    Depression Father    Breast cancer Sister     Social History Social History   Tobacco Use   Smoking status: Never   Smokeless tobacco: Never  Substance Use Topics   Alcohol use: Yes    Comment: 5-8 oz   Drug use: Yes    Types: Marijuana    Comment: Very rare for pain     Allergies   Patient has no known allergies.   Review of Systems Review of Systems As per HPI  Physical Exam Triage Vital Signs ED Triage Vitals  Enc Vitals Group     BP 02/11/23 1316 (!)  183/82     Pulse Rate 02/11/23 1316 95     Resp 02/11/23 1316 18     Temp 02/11/23 1316 98.6 F (37 C)     Temp Source 02/11/23 1316 Oral     SpO2 02/11/23 1316 97 %     Weight --      Height --      Head Circumference --      Peak Flow --      Pain Score 02/11/23 1315 7     Pain Loc --      Pain Edu? --      Excl. in GC? --    No data found.  Updated Vital Signs BP (!) 183/82 (BP Location: Right Arm)   Pulse 95   Temp 98.6 F (37 C) (Oral)   Resp 18   SpO2 97%   Visual Acuity Right Eye Distance:   Left Eye Distance:   Bilateral Distance:    Right Eye Near:   Left Eye Near:    Bilateral Near:     Physical Exam Eyes:     General: No visual field deficit.    Extraocular Movements: Extraocular movements intact.     Left eye: Normal extraocular motion.  Pulmonary:     Effort: Pulmonary effort is normal.     Breath sounds: Normal breath sounds.  Abdominal:     General: Bowel sounds are normal.     Palpations: Abdomen is soft.  Musculoskeletal:        General: Normal range of motion.  Skin:    General: Skin is warm.  Neurological:     GCS: GCS eye subscore is 4. GCS verbal subscore is 5. GCS motor subscore is 6.     Cranial Nerves: No cranial nerve deficit, dysarthria or facial asymmetry.     Sensory: No sensory deficit.     Motor: No weakness.  Psychiatric:        Mood and Affect: Mood normal.        Behavior: Behavior normal.      UC Treatments / Results  Labs (all labs ordered are listed, but only abnormal results are displayed) Labs Reviewed - No data to display  EKG   Radiology No results found.  Procedures Procedures (including critical care time)  Medications Ordered in UC Medications - No data to display  Initial Impression / Assessment and Plan / UC Course  I have reviewed the triage vital signs and the nursing notes.  Pertinent labs & imaging results that were available during my care of the patient were reviewed by me and  considered in my medical decision  making (see chart for details).     1.  Uncontrolled hypertension manifesting as headache: Patient is advised to continue amlodipine 10 mg orally daily Tylenol as needed for pain Maintain adequate hydration Decrease salt intake to less than 2 g in 24 hours Physical activity at least 150 minutes a week Return to urgent care to be reevaluated symptoms worsen. Final Clinical Impressions(s) / UC Diagnoses   Final diagnoses:  Uncontrolled hypertension  Cervicogenic headache     Discharge Instructions      Please start your blood pressure medication once you get home The headaches are likely secondary to uncontrolled blood pressure The recent lab work you did is reassuring If you have persistent or worsening headache please return to urgent care to be reevaluated.    ED Prescriptions   None    PDMP not reviewed this encounter.   Merrilee JanskyLamptey, Yamil Dougher O, MD 02/12/23 [phone]

## 2023-04-12 ENCOUNTER — Encounter: Payer: Self-pay | Admitting: Dietician

## 2023-04-12 ENCOUNTER — Encounter: Payer: No Typology Code available for payment source | Attending: Internal Medicine | Admitting: Dietician

## 2023-04-12 DIAGNOSIS — R7303 Prediabetes: Secondary | ICD-10-CM | POA: Diagnosis present

## 2023-04-12 DIAGNOSIS — I10 Essential (primary) hypertension: Secondary | ICD-10-CM | POA: Insufficient documentation

## 2023-04-12 NOTE — Progress Notes (Signed)
Medical Nutrition Therapy  Appointment Start time:  10:00  Appointment End time:  11:07  Primary concerns today: eating on a regular schedule; states she skips meals Referral diagnosis: HTN Preferred learning style: no preference indicated (auditory, visual, hands on, no preference indicated) Learning readiness: preparation (not ready, contemplating, ready, change in progress)  NUTRITION ASSESSMENT   Anthropometrics  Weight:  Height:    Clinical Medical Hx: arthritis, GERD, sleep apnea, HTN Medications: amlodipine (NORVASC) 5 MG tablet,Duloxetine (CYMBALTA) 30 MG capsule, Pantoprazole (for GERD), Vitamin D Labs: A1c 6.0; HCT 33.6 Notable Signs/Symptoms: none noted  Lifestyle & Dietary Hx  Pt states she has lost a lot of weight, stating she was 127 and gained to 185. Pt states she lost that weight when she started on ADHD medication. Pt states she only eats when she is hungry, stating she will eat cereal before going to bed, she sill not be hungry in the morning. Pt states since she does not always remember to take her supplements because she is not on a regular eating schedule. Pt states she will get hungry after not eating breakfast, stating it will affect her mood as well. Pt states she even smoked mariajuana to gain an appetite, stating it did not help. Pt states she is seeing a cancer specialist, for preventative cancer screening.  Estimated daily fluid intake: 60 oz maybe. Supplements: Vit D, supposed to take calcium Sleep: pretty good, asleep at 10 and up at 5:30 Stress / self-care: exercise Current average weekly physical activity: walking at work at school; 6,000 to 10,000 steps a day.  24-Hr Dietary Recall First Meal: coffee, skip Snack: maybe chips or soda Second Meal: 1/2 cheese steak and fries Snack:  Third Meal: skip (go to sleep) or one pot meals Snack: cereal or chips or ice cream bars/drumstick Beverages: coffee (1 cup in the morning), ginger ale, sprite,  water  Estimated Energy Needs Calories: 1500  NUTRITION DIAGNOSIS  NB-1.5 Disordered eating pattern As related to skipping meals.  As evidenced by dietary recall and pt stating she has no appetite.  NUTRITION INTERVENTION  Nutrition education (E-1) on the following topics:  Fruits & Vegetables: Aim to fill half your plate with a variety of fruits and vegetables. They are rich in vitamins, minerals, and fiber, and can help reduce the risk of chronic diseases. Choose a colorful assortment of fruits and vegetables to ensure you get a wide range of nutrients. Grains and Starches: Make at least half of your grain choices whole grains, such as Veronica Owen rice, whole wheat bread, and oats. Whole grains provide fiber, which aids in digestion and healthy cholesterol levels. Aim for whole forms of starchy vegetables such as potatoes, sweet potatoes, beans, peas, and corn, which are fiber rich and provide many vitamins and minerals.  Protein: Incorporate lean sources of protein, such as poultry, fish, beans, nuts, and seeds, into your meals. Protein is essential for building and repairing tissues, staying full, balancing blood sugar, as well as supporting immune function. Dairy: Include low-fat or fat-free dairy products like milk, yogurt, and cheese in your diet. Dairy foods are excellent sources of calcium and vitamin D, which are crucial for bone health.  Physical Activity: Aim for 150 minutes of physical activity weekly. Regular physical activity promotes overall health-including helping to reduce risk for heart disease and diabetes, promoting mental health, and helping [place] sleep better.  Why you need complex carbohydrates: Whole grains and other complex carbohydrates are required to have a healthy diet. Whole grains  provide fiber which can help with blood glucose levels and help keep you satiated. Fruits and starchy vegetables provide essential vitamins and minerals required for immune function, eyesight support,  brain support, bone density, wound healing and many other functions within the body. According to the current evidenced based 2020-2025 Dietary Guidelines for Americans, complex carbohydrates are part of a healthy eating pattern which is associated with a decreased risk for type 2 diabetes, cancers, and cardiovascular disease.  Encouraged pt to continue to eat balanced meals inclusive of non starchy vegetables 2 times a day 7 days a week Encouraged pt to choose lean protein sources: limiting beef, pork, sausage, hotdogs, and lunch meat Encourage pt to choose healthy fats such as plant based limiting animal fats Encouraged pt to continue to drink a minium 64 fluid ounces with half being plain water to satisfy proper hydration    Handouts Provided Include  Meal Ideas Types of Fat (saturated vs unsaturated) Health Benefits of Physical Activity  Learning Style & Readiness for Change Teaching method utilized: Visual & Auditory  Demonstrated degree of understanding via: Teach Back  Barriers to learning/adherence to lifestyle change: poor appetite   Goals Established by Pt Meal plan and prep using the meal ideas handout Aim for 2 or more servings of non-starchy vegetables per day Increase physical activity, work out consistently right after school 3-5 days a week, 30-60 minutes  MONITORING & EVALUATION Dietary intake, weekly physical activity.  Next Steps  Patient is to follow-up in August.

## 2023-04-21 ENCOUNTER — Ambulatory Visit: Payer: No Typology Code available for payment source | Admitting: Dietician

## 2023-06-22 ENCOUNTER — Ambulatory Visit: Payer: No Typology Code available for payment source | Admitting: Dietician

## 2023-06-30 ENCOUNTER — Ambulatory Visit: Payer: No Typology Code available for payment source | Admitting: Podiatry

## 2023-07-07 ENCOUNTER — Ambulatory Visit (INDEPENDENT_AMBULATORY_CARE_PROVIDER_SITE_OTHER): Payer: No Typology Code available for payment source | Admitting: Podiatry

## 2023-07-07 DIAGNOSIS — Q666 Other congenital valgus deformities of feet: Secondary | ICD-10-CM

## 2023-07-07 NOTE — Progress Notes (Signed)
Subjective:  Patient ID: Veronica Owen, female    DOB: March 02, 1971,  MRN: [id number]  Chief Complaint  Patient presents with   Foot Pain    52 y.o. female presents with the above complaint.  Patient presents with complaint of bilateral heel pain that has been going on for quite some time.  She states that she has not seen anyone else prior to seeing me.  She started get some arch and heel pain and wanted get it evaluated.  She denies any other acute complaints pain scale 7 out of 10.  Dull achy in nature   Review of Systems: Negative except as noted in the HPI. Denies N/V/F/Ch.  Past Medical History:  Diagnosis Date   Anxiety    Arthritis    Back pain    Depression    Headache    Knee pain bilat   Neck pain     Current Outpatient Medications:    amitriptyline (ELAVIL) 10 MG tablet, Take 2 tablets (20 mg total) by mouth at bedtime., Disp: 60 tablet, Rfl: 6   amLODipine (NORVASC) 5 MG tablet, Take 10 mg by mouth daily. , Disp: , Rfl:    amphetamine-dextroamphetamine (ADDERALL) 10 MG tablet, Take 1 tablet by mouth 2 (two) times daily., Disp: , Rfl:    Cholecalciferol (VITAMIN D PO), Take 2,000 Int'l Units by mouth daily., Disp: , Rfl:    ciclopirox (PENLAC) 8 % solution, Apply topically at bedtime. Apply over nail and surrounding skin. Apply daily over previous coat. After seven (7) days, may remove with alcohol and continue cycle., Disp: 6.6 mL, Rfl: 2   cyclobenzaprine (FLEXERIL) 10 MG tablet, Take 10 mg by mouth as needed for muscle spasms., Disp: , Rfl:    DULoxetine (CYMBALTA) 30 MG capsule, Take 90 mg by mouth daily., Disp: , Rfl:    fluticasone (FLONASE) 50 MCG/ACT nasal spray, Place into both nostrils daily., Disp: , Rfl:    GABAPENTIN PO, Take 200 mg by mouth at bedtime. , Disp: , Rfl:    hydrOXYzine (ATARAX/VISTARIL) 10 MG tablet, Take 10 mg by mouth as needed., Disp: , Rfl:    naproxen (NAPROSYN) 500 MG tablet, Take 500 mg by mouth as needed., Disp: , Rfl:     TRIPROLIDINE-PSEUDOEPHEDRINE PO, Take 1 tablet by mouth every 12 (twelve) hours., Disp: , Rfl:    Vitamin D, Ergocalciferol, (DRISDOL) 50000 units CAPS capsule, Take 50,000 Units by mouth every 7 (seven) days., Disp: , Rfl:  No current facility-administered medications for this visit.  Facility-Administered Medications Ordered in Other Visits:    gadopentetate dimeglumine (MAGNEVIST) injection 17 mL, 17 mL, Intravenous, Once PRN, Anson Fret, MD  Social History   Tobacco Use  Smoking Status Never  Smokeless Tobacco Never    No Known Allergies Objective:  There were no vitals filed for this visit. There is no height or weight on file to calculate BMI. Constitutional Well developed. Well nourished.  Vascular Dorsalis pedis pulses palpable bilaterally. Posterior tibial pulses palpable bilaterally. Capillary refill normal to all digits.  No cyanosis or clubbing noted. Pedal hair growth normal.  Neurologic Normal speech. Oriented to person, place, and time. Epicritic sensation to light touch grossly present bilaterally.  Dermatologic Nails well groomed and normal in appearance. No open wounds. No skin lesions.  Orthopedic: Gait examination shows pes planovalgus deformity with calcaneovalgus to many toe signs partially recruit the arch with dorsiflexion of the hallux unable to perform single and double heel raise this is a semiflexible flatfoot  Radiographs: None Assessment:   1. Pes planovalgus    Plan:  Patient was evaluated and treated and all questions answered.  Pes planovalgus -I explained to patient the etiology of pes planovalgus and relationship with Planter fasciitis and various treatment options were discussed.  Given patient foot structure in the setting of Planter fasciitis I believe patient will benefit from custom-made orthotics to help control the hindfoot motion support the arch of the foot and take the stress away from plantar fascial.  Patient agrees with  the plan like to proceed with orthotics -Patient was casted for orthotics  -  No follow-ups on file.

## 2023-09-21 ENCOUNTER — Other Ambulatory Visit: Payer: No Typology Code available for payment source

## 2024-05-08 ENCOUNTER — Ambulatory Visit: Payer: Self-pay

## 2024-05-08 NOTE — Progress Notes (Signed)
 Patient presents today to pick up custom molded foot orthotics, diagnosed with pes planus by Dr. Allena Katz.   Orthotics were dispensed and fit was satisfactory. Reviewed instructions for break-in and wear. Written instructions given to patient.  Patient will follow up as needed.   Addison Bailey Cped, CFo, CFm

## 2024-11-10 ENCOUNTER — Ambulatory Visit
Admission: RE | Admit: 2024-11-10 | Discharge: 2024-11-10 | Disposition: A | Discharge status: Home / Self Care | Source: Ambulatory Visit

## 2024-11-10 VITALS — BP 153/89 | HR 112 | Temp 98.7°F | Resp 20

## 2024-11-10 DIAGNOSIS — J069 Acute upper respiratory infection, unspecified: Secondary | ICD-10-CM | POA: Diagnosis not present

## 2024-11-10 MED ORDER — BENZONATATE 100 MG PO CAPS: 100.0000 mg | ORAL_CAPSULE | Freq: Three times a day (TID) | ORAL | 0 refills | Status: AC

## 2024-11-10 MED ORDER — PREDNISONE 10 MG (21) PO TBPK: ORAL_TABLET | Freq: Every day | ORAL | 0 refills | Status: AC

## 2024-11-10 MED ORDER — KETOROLAC TROMETHAMINE 30 MG/ML IJ SOLN
30.0000 mg | Freq: Once | INTRAMUSCULAR | Status: AC
  Administered 2024-11-10: 30 mg via INTRAMUSCULAR

## 2024-11-10 MED ORDER — PROMETHAZINE-DM 6.25-15 MG/5ML PO SYRP: 5.0000 mL | ORAL_SOLUTION | Freq: Every evening | ORAL | 0 refills | Status: AC | PRN

## 2024-11-10 MED ORDER — AMOXICILLIN-POT CLAVULANATE 875-125 MG PO TABS: 1.0000 | ORAL_TABLET | Freq: Two times a day (BID) | ORAL | 0 refills | Status: AC

## 2024-11-10 NOTE — ED Provider Notes (Signed)
 " [facility]    CSN: [id number] Arrival date & time: 11/10/24  1109      History   Chief Complaint Chief Complaint  Patient presents with   Cough    Entered by patient   Back Pain    HPI Veronica Owen is a 54 y.o. female.   Patient presents for evaluation of nasal congestion, postnasal drip, sore throat and a nonproductive cough present for 10 days.  Congestion improves throughout the day but worsens overnight.  Sore throat only occurring to the right side.  Has chronic low back pain to the right side which has flared since cough has been present, denies numbness or tingling or urinary symptoms.  Has attempted use of Mucinex and hot tea.  Exposure to RSV and flu within home.  Denies respiratory history, non-smoker.  Denies presence of fever, shortness of breath or wheezing.   Past Medical History:  Diagnosis Date   Anxiety    Arthritis    Back pain    Depression    Headache    Knee pain bilat   Neck pain     Patient Active Problem List   Diagnosis Date Noted   Lumbar radiculopathy 04/20/2018   Pain 03/04/2017   Chronic migraine w/o aura w/o status migrainosus, not intractable 12/09/2016   Fibromyalgia 12/09/2016   Cervical radiculitis 12/09/2016   Sciatica 12/09/2016   Anxiety 12/09/2016   Depression 12/09/2016    Past Surgical History:  Procedure Laterality Date   CESAREAN SECTION  1994    OB History   No obstetric history on file.      Home Medications    Prior to Admission medications  Medication Sig Start Date End Date Taking? Authorizing Provider  meloxicam  (MOBIC ) 15 MG tablet Take 15 mg by mouth daily. 09/27/24  Yes [provider]  amitriptyline  (ELAVIL ) 10 MG tablet Take 2 tablets (20 mg total) by mouth at bedtime. 12/09/16   Ines Onetha NOVAK, MD  amLODipine (NORVASC) 5 MG tablet Take 10 mg by mouth daily.  10/26/16   [provider]  amphetamine-dextroamphetamine (ADDERALL) 10 MG tablet Take 1 tablet by mouth 2  (two) times daily. 12/01/17   [provider]  Cholecalciferol (VITAMIN D PO) Take 2,000 Int'l Units by mouth daily.    [provider]  ciclopirox  (PENLAC ) 8 % solution Apply topically at bedtime. Apply over nail and surrounding skin. Apply daily over previous coat. After seven (7) days, may remove with alcohol and continue cycle. 06/08/22   Gershon Donnice SAUNDERS, DPM  cyclobenzaprine (FLEXERIL) 10 MG tablet Take 10 mg by mouth as needed for muscle spasms.    [provider]  DULoxetine (CYMBALTA) 30 MG capsule Take 90 mg by mouth daily.    [provider]  fluticasone (FLONASE) 50 MCG/ACT nasal spray Place into both nostrils daily.    [provider]  GABAPENTIN PO Take 200 mg by mouth at bedtime.     [provider]  hydrOXYzine (ATARAX/VISTARIL) 10 MG tablet Take 10 mg by mouth as needed.    [provider]  naproxen (NAPROSYN) 500 MG tablet Take 500 mg by mouth as needed. 09/08/16   [provider]  TRIPROLIDINE-PSEUDOEPHEDRINE PO Take 1 tablet by mouth every 12 (twelve) hours.    [provider]  Vitamin D, Ergocalciferol, (DRISDOL) 50000 units CAPS capsule Take 50,000 Units by mouth every 7 (seven) days.    [provider]    Family History Family History  Problem Relation Age of Onset   High Cholesterol Mother    Diabetes Mother    Hypertension Father    Anxiety disorder Father    Depression Father    Breast cancer Sister     Social History Social History[1]   Allergies   Pregabalin   Review of Systems Review of Systems  Constitutional: Negative.   HENT:  Positive for congestion, postnasal drip and sore throat. Negative for dental problem, drooling, ear discharge, ear pain, facial swelling, hearing loss, mouth sores, nosebleeds, rhinorrhea, sinus pressure, sinus pain, sneezing, tinnitus, trouble swallowing and voice change.   Respiratory:  Positive for cough. Negative for apnea, choking,  chest tightness, shortness of breath, wheezing and stridor.   Gastrointestinal: Negative.      Physical Exam Triage Vital Signs ED Triage Vitals  Encounter Vitals Group     BP 11/10/24 1122 (!) 153/89     Girls Systolic BP Percentile --      Girls Diastolic BP Percentile --      Boys Systolic BP Percentile --      Boys Diastolic BP Percentile --      Pulse Rate 11/10/24 1122 (!) 112     Resp 11/10/24 1122 20     Temp 11/10/24 1122 98.7 F (37.1 C)     Temp Source 11/10/24 1122 Oral     SpO2 11/10/24 1122 97 %     Weight --      Height --      Head Circumference --      Peak Flow --      Pain Score 11/10/24 1125 7     Pain Loc --      Pain Education --      Exclude from Growth Chart --    No data found.  Updated Vital Signs BP (!) 153/89 (BP Location: Right Arm)   Pulse (!) 112   Temp 98.7 F (37.1 C) (Oral)   Resp 20   SpO2 97%   Visual Acuity Right Eye Distance:   Left Eye Distance:   Bilateral Distance:    Right Eye Near:   Left Eye Near:    Bilateral Near:     Physical Exam Constitutional:      Appearance: Normal appearance.  HENT:     Right Ear: Tympanic membrane, ear canal and external ear normal.     Left Ear: Tympanic membrane, ear canal and external ear normal.     Nose: Congestion present.     Mouth/Throat:     Pharynx: No oropharyngeal exudate or posterior oropharyngeal erythema.  Cardiovascular:     Rate and Rhythm: Normal rate and regular rhythm.     Pulses: Normal pulses.     Heart sounds: Normal heart sounds.  Pulmonary:     Effort: Pulmonary effort is normal.     Breath sounds: Normal breath sounds.  Musculoskeletal:     Cervical back: Normal range of motion.     Comments: Tenderness to the right lower aspect of the lumbar region without ecchymosis swelling or deformity, able to sit erect without complication, negative straight leg test, no spinal tenderness noted on exam  Lymphadenopathy:     Cervical: No cervical adenopathy.   Neurological:     Mental Status: She is alert.      UC Treatments / Results  Labs (all labs ordered are listed, but only abnormal results are displayed) Labs Reviewed - No data to display  EKG   Radiology No results found.  Procedures Procedures (including critical care time)  Medications Ordered in UC Medications - No data to display  Initial Impression / Assessment and Plan / UC Course  I have reviewed the triage vital signs and the nursing notes.  Pertinent labs & imaging results that were available during my care of the patient were reviewed by me and considered in my medical decision making (see chart for details).  Acute URI  Patient is in no signs of distress nor toxic appearing.  Vital signs are stable.  Low suspicion for pneumonia, pneumothorax or bronchitis and therefore will defer imaging.  Viral testing deferred due to timeline.  Prescribed Augmentin, Tessalon, promethazine DM and prednisone, Toradol  IM given prior to discharge.May use additional over-the-counter medications as needed for supportive care.  May follow-up with urgent care as needed if symptoms persist or worsen.  Note given.   Final Clinical Impressions(s) / UC Diagnoses   Final diagnoses:  None   Discharge Instructions   None    ED Prescriptions   None    PDMP not reviewed this encounter.     [1]  Social History Tobacco Use   Smoking status: Never   Smokeless tobacco: Never  Substance Use Topics   Alcohol use: Yes    Comment: 5-8 oz   Drug use: Not Currently    Types: Marijuana    Comment: Very rare for pain     Teresa Shelba SAUNDERS, NP 11/10/24 1223  "

## 2024-11-10 NOTE — ED Triage Notes (Signed)
 Patient reports her grandson was diagnosed with RSV, and Flu 10 days ago. Patient now complains cough and lower back pain x 7 days. Patient has taken mucinex with mild relief. Rates pain  7/10.

## 2024-11-10 NOTE — Discharge Instructions (Signed)
 Today you are evaluated for your upper respiratory symptoms  Begin Augmentin twice daily for 7 days for treatment of bacteria causing symptoms to linger  You have been given an injection of Toradol  to help reduce back pain, ideally will see improvement within the next 30 minutes to an hour as this medicine helps to reduce inflammation  Start tomorrow take prednisone to help reduce inflammation throughout the entire body will help with persisting cough as well as back pain  You may use Tessalon pill every 8 hours as needed for cough and may use cough syrup at bedtime to allow you to rest  You can take Tylenol  as needed for fever reduction and pain relief.   For cough: honey 1/2 to 1 teaspoon (you can dilute the honey in water or another fluid).  You can also use guaifenesin and dextromethorphan for cough. You can use a humidifier for chest congestion and cough.  If you don't have a humidifier, you can sit in the bathroom with the hot shower running.      For sore throat: try warm salt water gargles, cepacol lozenges, throat spray, warm tea or water with lemon/honey, popsicles or ice, or OTC cold relief medicine for throat discomfort.   For congestion: take a daily anti-histamine like Zyrtec, Claritin, and a oral decongestant, such as pseudoephedrine.  You can also use Flonase 1-2 sprays in each nostril daily.   It is important to stay hydrated: drink plenty of fluids (water, gatorade/powerade/pedialyte, juices, or teas) to keep your throat moisturized and help further relieve irritation/discomfort.
